# Patient Record
Sex: Female | Born: 1945 | Race: Black or African American | Hispanic: No | Marital: Single | Smoking: Current every day smoker
Health system: Southern US, Community
[De-identification: ages and names within clinical notes are randomized; demographics above are authoritative.]

## PROBLEM LIST (undated history)

## (undated) DIAGNOSIS — E119 Type 2 diabetes mellitus without complications: Secondary | ICD-10-CM

## (undated) DIAGNOSIS — J449 Chronic obstructive pulmonary disease, unspecified: Secondary | ICD-10-CM

## (undated) DIAGNOSIS — I10 Essential (primary) hypertension: Secondary | ICD-10-CM

## (undated) DIAGNOSIS — J309 Allergic rhinitis, unspecified: Secondary | ICD-10-CM

## (undated) DIAGNOSIS — G43909 Migraine, unspecified, not intractable, without status migrainosus: Secondary | ICD-10-CM

## (undated) DIAGNOSIS — G629 Polyneuropathy, unspecified: Secondary | ICD-10-CM

## (undated) DIAGNOSIS — E785 Hyperlipidemia, unspecified: Secondary | ICD-10-CM

## (undated) HISTORY — PX: NO PAST SURGERIES: SHX2092

## (undated) HISTORY — DX: Essential (primary) hypertension: I10

## (undated) HISTORY — DX: Migraine, unspecified, not intractable, without status migrainosus: G43.909

## (undated) HISTORY — DX: Type 2 diabetes mellitus without complications: E11.9

---

## 2015-10-01 ENCOUNTER — Ambulatory Visit (INDEPENDENT_AMBULATORY_CARE_PROVIDER_SITE_OTHER): Payer: Medicare Other

## 2015-10-01 ENCOUNTER — Ambulatory Visit (INDEPENDENT_AMBULATORY_CARE_PROVIDER_SITE_OTHER): Payer: Medicare Other | Admitting: Podiatry

## 2015-10-01 ENCOUNTER — Encounter: Payer: Self-pay | Admitting: Podiatry

## 2015-10-01 VITALS — BP 153/93 | HR 86 | Resp 16

## 2015-10-01 DIAGNOSIS — R261 Paralytic gait: Secondary | ICD-10-CM

## 2015-10-01 DIAGNOSIS — E1149 Type 2 diabetes mellitus with other diabetic neurological complication: Secondary | ICD-10-CM | POA: Diagnosis not present

## 2015-10-01 DIAGNOSIS — R52 Pain, unspecified: Secondary | ICD-10-CM

## 2015-10-01 DIAGNOSIS — R2689 Other abnormalities of gait and mobility: Secondary | ICD-10-CM

## 2015-10-01 DIAGNOSIS — M8430XA Stress fracture, unspecified site, initial encounter for fracture: Secondary | ICD-10-CM | POA: Diagnosis not present

## 2015-10-01 DIAGNOSIS — M21379 Foot drop, unspecified foot: Secondary | ICD-10-CM

## 2015-10-01 NOTE — Progress Notes (Signed)
   Subjective:    Patient ID: Sabrina Hanson, female    DOB: 1946-07-28, 70 y.o.   MRN: [id number]  HPI  70 year old female presents the office today with her son for concerns of right foot pain and she's had difficulty walking over the last year. She states that she has weakness to her right foot and she feels that she drags her right foot when she walks. There is been no recent injury or trauma at the time of onset of symptoms or recently. She's had no history of stroke previously. She does have a bunion on the right side for which her son is concerned about. There is been no swelling or redness. No calf pain with walking. Pain seems be localized to the foot.  She is diabetic and states her blood sugars typically "good". Yesterday morning it is 98. She gets tingling and numbness to her foot with the right worse than the left.    Review of Systems  Constitutional:       Sinus problems  Cardiovascular: Positive for leg swelling.       Calf pain with walking  Neurological: Positive for headaches.       Objective:   Physical Exam General: AAO x3, NAD  Dermatological: Skin is warm, dry and supple bilateral. There are no open sores, no preulcerative lesions, no rash or signs of infection present.  Vascular: Dorsalis Pedis artery and Posterior Tibial artery pedal pulses are 1/4 bilateral with immedate capillary fill time. There is no pain with calf compression, swelling, warmth, erythema.   Neruologic: There appears to be decrease in sensation with Semmes-Weinstein monofilament bilaterally as well as decreased vibratory sensation on the right side. Achilles tendon reflex decreased on the right.  Musculoskeletal: HAV is present right > left. There is tenderness to palpation of the first metatarsal right foot. No other areas of pinpoint bony tenderness to bilateral lower extremities. There is greatly reduced dorsiflexion of the right ankle compared to the left, otherwise MMT intact. Range of  motion appears to be intact.  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 There is a transverse sclerotic line of the first metatarsal diaphysis. She does have pain upon this area. Cannot rule out stress fracture or recent injury. Will immobilize and a surgical shoe which was dispensed today. 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 -Arterial studies to evaluate for circulation. -Patient's son was concerned about the bunion and wanting the bunion corrected. However given her other issues we will on that for now. -Discussed physical therapy however will await neurology evaluation. Also discussed bracing and diabetic shoes. -Follow-up 2-3 weeks or sooner if any problems arise. In the meantime, encouraged to call the office with any questions, concerns, change in symptoms.   Celesta Gentile, DPM

## 2015-10-05 ENCOUNTER — Telehealth: Payer: Self-pay | Admitting: *Deleted

## 2015-10-05 DIAGNOSIS — R2689 Other abnormalities of gait and mobility: Principal | ICD-10-CM

## 2015-10-05 DIAGNOSIS — M21379 Foot drop, unspecified foot: Secondary | ICD-10-CM

## 2015-10-05 NOTE — Telephone Encounter (Addendum)
-----   Message from Trula Slade, DPM sent at 10/04/2015  5:10 PM EST ----- She needs neurology evaluation for drop foot on the right and neuropathy.   Also, arterial studies for decreased circulation.   Thanks. 10/05/2015 - faxed required [facility] referral form, pt clinicals and demographics.  Referral to [facility] faxed with pt demographic.

## 2015-10-08 ENCOUNTER — Other Ambulatory Visit: Payer: Self-pay | Admitting: Podiatry

## 2015-10-08 DIAGNOSIS — M21379 Foot drop, unspecified foot: Secondary | ICD-10-CM

## 2015-10-08 DIAGNOSIS — R2689 Other abnormalities of gait and mobility: Principal | ICD-10-CM

## 2015-10-15 ENCOUNTER — Encounter (HOSPITAL_COMMUNITY): Payer: Self-pay

## 2015-10-21 ENCOUNTER — Ambulatory Visit (HOSPITAL_COMMUNITY)
Admission: RE | Admit: 2015-10-21 | Discharge: 2015-10-21 | Disposition: A | Discharge status: Home / Self Care | Payer: Medicare Other | Source: Ambulatory Visit | Attending: Podiatry | Admitting: Podiatry

## 2015-10-21 DIAGNOSIS — R938 Abnormal findings on diagnostic imaging of other specified body structures: Secondary | ICD-10-CM | POA: Insufficient documentation

## 2015-10-21 DIAGNOSIS — I700 Atherosclerosis of aorta: Secondary | ICD-10-CM | POA: Diagnosis not present

## 2015-10-21 DIAGNOSIS — R0989 Other specified symptoms and signs involving the circulatory and respiratory systems: Secondary | ICD-10-CM | POA: Diagnosis present

## 2015-10-21 DIAGNOSIS — I771 Stricture of artery: Secondary | ICD-10-CM | POA: Diagnosis not present

## 2015-10-21 DIAGNOSIS — I708 Atherosclerosis of other arteries: Secondary | ICD-10-CM | POA: Insufficient documentation

## 2015-10-21 DIAGNOSIS — E119 Type 2 diabetes mellitus without complications: Secondary | ICD-10-CM | POA: Insufficient documentation

## 2015-10-21 DIAGNOSIS — R261 Paralytic gait: Secondary | ICD-10-CM | POA: Diagnosis not present

## 2015-10-21 DIAGNOSIS — R2689 Other abnormalities of gait and mobility: Secondary | ICD-10-CM

## 2015-10-21 DIAGNOSIS — M21379 Foot drop, unspecified foot: Secondary | ICD-10-CM

## 2015-10-23 ENCOUNTER — Ambulatory Visit: Payer: Medicare Other | Admitting: Neurology

## 2015-10-26 ENCOUNTER — Ambulatory Visit (INDEPENDENT_AMBULATORY_CARE_PROVIDER_SITE_OTHER): Payer: Medicare Other

## 2015-10-26 ENCOUNTER — Ambulatory Visit (INDEPENDENT_AMBULATORY_CARE_PROVIDER_SITE_OTHER): Payer: Medicare Other | Admitting: Podiatry

## 2015-10-26 ENCOUNTER — Encounter: Payer: Self-pay | Admitting: Podiatry

## 2015-10-26 VITALS — BP 171/93 | HR 79 | Resp 16

## 2015-10-26 DIAGNOSIS — M8430XS Stress fracture, unspecified site, sequela: Secondary | ICD-10-CM | POA: Diagnosis not present

## 2015-10-26 DIAGNOSIS — R261 Paralytic gait: Secondary | ICD-10-CM | POA: Diagnosis not present

## 2015-10-26 DIAGNOSIS — I739 Peripheral vascular disease, unspecified: Secondary | ICD-10-CM | POA: Diagnosis not present

## 2015-10-26 DIAGNOSIS — M21379 Foot drop, unspecified foot: Secondary | ICD-10-CM

## 2015-10-26 DIAGNOSIS — R52 Pain, unspecified: Secondary | ICD-10-CM | POA: Diagnosis not present

## 2015-10-26 DIAGNOSIS — E1149 Type 2 diabetes mellitus with other diabetic neurological complication: Secondary | ICD-10-CM | POA: Diagnosis not present

## 2015-10-26 DIAGNOSIS — R2689 Other abnormalities of gait and mobility: Secondary | ICD-10-CM

## 2015-10-27 NOTE — Telephone Encounter (Signed)
-----   Message from Trula Slade, DPM sent at 10/27/2015  7:53 AM EST ----- Can you please order her consult with Dr. Gwenlyn Found? She has previous studies done which are abnormal.

## 2015-10-27 NOTE — Progress Notes (Signed)
Patient ID: Sabrina Hanson, female   DOB: 03/08/1946, 70 y.o.   MRN: [id number]  Subjective:  70 year old female presents the office today for follow-up evaluation of decreased pulses, possible right first metatarsal fracture as well as right foot drop foot. She states that she is doing well and she is having no pain of the foot. She has been continuing the surgical shoe of the right foot. Denies any swelling or redness. Denies any systemic complaints such as fevers, chills, nausea, vomiting. No acute changes since last appointment, and no other complaints at this time.   Objective: AAO x3, NAD DP/PT pulses decreased bilaterally, CRT less than 3 seconds Protective sensation decreased with Simms Weinstein monofilament Bunion deformity is present most on the right worse than the left with pre-ulcerative lesions over on the first metatarsal head. There is no overlying edema, erythema, increased warmth. There is no tenderness to palpation of the right first metatarsal is no overlying erythema or edema or increase in warmth. There is minimal dorsiflexion of the right ankle. Subjectively she does drag her foot.  No edema, erythema, increase in warmth to bilateral lower extremities.  No open lesions or pre-ulcerative lesions.  No pain with calf compression, swelling, warmth, erythema  Assessment: Possible fracture right 1st metatarsal, although likely old/healing; dropfoot; PVD  Plan: -All treatment options discussed with the patient including all alternatives, risks, complications.  -X-rays were obtained and reviewed with the patient.  A sclerotic line remains transversing the first metatarsal diaphysis which appears improved from the last one in. This is likely result of an old fracture or healing stress fracture. Continue surgical shoe for now.  -Will  order vascular consult for PVD. Arterial studies were reviewed which revealed 50-74% stenosis the right distal SFA with occluded peroneal bilaterally  and two-vessel runoff. ABI on the right 0.62, left 0.94 -Offloading pads for bunion/pre-ulcerative lesion. Monitor for any further skin breakdown.  - She has an upcoming neurology evaluation her dropfoot. Apparently this started recently.  -F/U in 2 weeks -Patient encouraged to call the office with any questions, concerns, change in symptoms.   Celesta Gentile, DPM

## 2015-10-28 ENCOUNTER — Encounter: Payer: Self-pay | Admitting: Neurology

## 2015-10-28 ENCOUNTER — Encounter: Payer: Self-pay | Admitting: *Deleted

## 2015-10-28 ENCOUNTER — Encounter (INDEPENDENT_AMBULATORY_CARE_PROVIDER_SITE_OTHER): Payer: Self-pay

## 2015-10-28 ENCOUNTER — Ambulatory Visit (INDEPENDENT_AMBULATORY_CARE_PROVIDER_SITE_OTHER): Payer: Medicare Other | Admitting: Neurology

## 2015-10-28 VITALS — BP 150/77 | HR 67 | Ht 62.0 in | Wt 187.4 lb

## 2015-10-28 DIAGNOSIS — M5417 Radiculopathy, lumbosacral region: Secondary | ICD-10-CM

## 2015-10-28 DIAGNOSIS — E0841 Diabetes mellitus due to underlying condition with diabetic mononeuropathy: Secondary | ICD-10-CM

## 2015-10-28 DIAGNOSIS — M21371 Foot drop, right foot: Secondary | ICD-10-CM | POA: Diagnosis not present

## 2015-10-28 DIAGNOSIS — E538 Deficiency of other specified B group vitamins: Secondary | ICD-10-CM | POA: Diagnosis not present

## 2015-10-28 DIAGNOSIS — G629 Polyneuropathy, unspecified: Secondary | ICD-10-CM

## 2015-10-28 DIAGNOSIS — W19XXXA Unspecified fall, initial encounter: Secondary | ICD-10-CM

## 2015-10-28 NOTE — Patient Instructions (Signed)
Remember to drink plenty of fluid, eat healthy meals and do not skip any meals. Try to eat protein with a every meal and eat a healthy snack such as fruit or nuts in between meals. Try to keep a regular sleep-wake schedule and try to exercise daily, particularly in the form of walking, 20-30 minutes a day, if you can.   As far as diagnostic testing: MRI lumbar spine, emg/ncs, labs, foot orthosis  I would like to see you back for emg/ncs, sooner if we need to. Please call us with any interim questions, concerns, problems, updates or refill requests.   Our phone number is [phone]. We also have an after hours call service for urgent matters and there is a physician on-call for urgent questions. For any emergencies you know to call 911 or go to the nearest emergency room

## 2015-10-28 NOTE — Progress Notes (Signed)
[facility]    Provider:  Dr Jaynee Eagles Referring Provider: No ref. provider found Primary Care Physician:  JESSA, [name] N, DO  CC:  Foot drop  HPI:  Sabrina Hanson is a 70 y.o. female here as a referral for foot drop. PMHx DM.  Patient says she fell over a year ago and at that time she was having no problem dragging the leg but the right leg was giving out. After that she was in a wheelchair, had a lot swelling in the foot. Very poor historian. Here with son who tries to provide information but he doesn't know either. Then slowly she had progressive weakness in the right leg, and numbness in the toes. Left leg is fine. At this point not getting better but not getting worse either. She has weakness and pain is around the ankle. She can't move her toes. She can't extend her toes. She has a boot on her right foot due to a hairline fracture. The boot goes to the ankle and it is for the fracture. Dr. Aleda Grana sent to PT for the foot drop and didn't help. Dragging her foot. She has low back pain It radiates from the low back does the lateral aspect and to the bottom of the toes. Left foot is unaffected. She doesn't know why she fell and says the back was not hurting atthat time. She has fallen. At the end of the appointment patient says her foot drop has been going on for longer than 3-4 years, son surprised. Patient is an extremely poor historian, unable to determine exact details.   Reviewed notes, labs and imaging from outside physicians, which showed:  XR right foot, personally reviewed images and agree with following: 3 views of a skeletally mature individual were obtained of the right foot.  Study includes AP, oblique, lateral projections.  there is a sclerotic line within diaphysis the first metatarsal concerning for stress fracture. Arthritic changes are present. Osteopenia is also present. There is no other evidence of acute fracture or stress fracture.  Review of  Systems: Patient complains of symptoms per HPI as well as the following symptoms: no CP, no SOB. Pertinent negatives per HPI. All others negative.   Social History   Social History  . Marital Status: Single    Spouse Name: N/A  . Number of Children: 1  . Years of Education: 12   Occupational History  . Retired    Social History Main Topics  . Smoking status: Current Every Day Smoker  . Smokeless tobacco: Never Used  . Alcohol Use: No  . Drug Use: No  . Sexual Activity: Not on file   Other Topics Concern  . Not on file   Social History Narrative   Lives alone   Caffeine use: Daily     Family History  Problem Relation Age of Onset  . Neurofibromatosis Neg Hx     Past Medical History  Diagnosis Date  . Diabetes mellitus without complication ([place])   . Migraine   . Hypertension     Past Surgical History  Procedure Laterality Date  . No past surgeries      Current Outpatient Prescriptions  Medication Sig Dispense Refill  . CETIRIZINE HCL PO Take 10 mg by mouth daily.     . CYCLOBENZAPRINE HCL PO Take 5 mg by mouth 3 (three) times daily.     Marland Kitchen GLIMEPIRIDE PO Take 1 mg by mouth daily with supper.     Marland Kitchen HYDROCHLOROTHIAZIDE PO Take 12.5  mg by mouth daily.     Marland Kitchen HYDROcodone-acetaminophen (NORCO) 10-325 MG tablet Take 1 tablet by mouth every 6 (six) hours as needed.    . insulin aspart (NOVOLOG) 100 UNIT/ML injection Inject 18 Units into the skin once. At night    . LISINOPRIL PO Take 40 mg by mouth daily.     Marland Kitchen METFORMIN HCL PO Take 1,000 mg by mouth 2 (two) times daily with a meal.     . METOPROLOL TARTRATE PO Take 50 mg by mouth 2 (two) times daily.     . Pregabalin (LYRICA PO) Take 150 mg by mouth 3 (three) times daily.     . vitamin B-12 (CYANOCOBALAMIN) 1000 MCG tablet Take 1,000 mcg by mouth daily.    . FUROSEMIDE PO Take by mouth. Reported on 10/28/2015    . HYDROCODONE-IBUPROFEN PO Take by mouth. Reported on 10/28/2015     No current facility-administered  medications for this visit.    Allergies as of 10/28/2015  . (No Known Allergies)    Vitals: BP 150/77 mmHg  Pulse 67  Ht 5\' 2"  (1.575 m)  Wt 187 lb 6.4 oz (85.004 kg)  BMI 34.27 kg/m2 Last Weight:  Wt Readings from Last 1 Encounters:  10/28/15 187 lb 6.4 oz (85.004 kg)   Last Height:   Ht Readings from Last 1 Encounters:  10/28/15 5\' 2"  (1.575 m)    Physical exam: Exam: Gen: NAD, obese                CV: RRR, no MRG. No Carotid Bruits. No peripheral edema, warm, nontender Eyes: Conjunctivae clear without exudates or hemorrhage  Neuro: Detailed Neurologic Exam  Speech:    Speech is normal; fluent and spontaneous with normal comprehension.  Cognition:    The patient is oriented to person, place, and time;     recent and remote memory intact;     language fluent;     normal attention, concentration,     fund of knowledge Cranial Nerves:    The pupils are equal, round, and reactive to light. The fundi are normal and spontaneous venous pulsations are present. Visual fields are full to finger confrontation. Extraocular movements are intact. Trigeminal sensation is intact and the muscles of mastication are normal. The face is symmetric. The palate elevates in the midline. Hearing intact. Voice is normal. Shoulder shrug is normal. The tongue has normal motion without fasciculations.   Coordination:    Normal finger to nose and heel to shin. Normal rapid alternating movements.   Gait:    Steppage gait  Motor Observation:    No asymmetry, no atrophy, and no involuntary movements noted. Tone:    Normal muscle tone.    Posture:    Posture is normal.     Strength:    Weakness right dorsiflexion and eversion     Sensation: Decreased distally to pp, temp     Reflex Exam:  DTR's:    Deep tendon reflexes in the upper and lower extremities are symmetrical bilaterally.   Toes:    The toes are downgoing bilaterally.   Clonus:    Clonus is absent.       Assessment/Plan:  70 year old with right foot drop for several years.  As far as diagnostic testing: MRI lumbar spine, emg/ncs, labs, foot orthosis, physical therapy  I would like to see you back for emg/ncs, sooner if we need to. Please call us with any interim questions, concerns, problems, updates or refill requests.  Fall risk, orthosis and PT wil help. Advised to use walking aid such as a cane until further eval from PT.  CC: Dr. Dawayne Cirri, [facility] [address] [place], [place] [address]  Phone [phone] Fax [phone]

## 2015-10-29 ENCOUNTER — Telehealth: Payer: Self-pay | Admitting: *Deleted

## 2015-10-29 LAB — COMPREHENSIVE METABOLIC PANEL
A/G RATIO: 1.4 (ref 1.1–2.5)
ALK PHOS: 58 IU/L (ref 39–117)
ALT: 15 IU/L (ref 0–32)
AST: 20 IU/L (ref 0–40)
Albumin: 4.1 g/dL (ref 3.6–4.8)
BUN/Creatinine Ratio: 15 (ref 11–26)
BUN: 22 mg/dL (ref 8–27)
Bilirubin Total: <0.2 mg/dL (ref 0.0–1.2)
CALCIUM: 9.8 mg/dL (ref 8.7–10.3)
CO2: 22 mmol/L (ref 18–29)
Chloride: 104 mmol/L (ref 96–106)
Creatinine, Ser: 1.47 mg/dL — ABNORMAL HIGH (ref 0.57–1.00)
GFR calc Af Amer: 42 mL/min/{1.73_m2} — ABNORMAL LOW (ref >59)
GFR, EST NON AFRICAN AMERICAN: 36 mL/min/{1.73_m2} — AB (ref >59)
Globulin, Total: 3 g/dL (ref 1.5–4.5)
Glucose: 83 mg/dL (ref 65–99)
POTASSIUM: 5.5 mmol/L — AB (ref 3.5–5.2)
Sodium: 141 mmol/L (ref 134–144)
Total Protein: 7.1 g/dL (ref 6.0–8.5)

## 2015-10-29 LAB — HEMOGLOBIN A1C
ESTIMATED AVERAGE GLUCOSE: 154 mg/dL
Hgb A1c MFr Bld: 7 % — ABNORMAL HIGH (ref 4.8–5.6)

## 2015-10-29 LAB — B12 AND FOLATE PANEL
FOLATE: 6.5 ng/mL (ref >3.0)
VITAMIN B 12: 548 pg/mL (ref 211–946)

## 2015-10-29 NOTE — Telephone Encounter (Signed)
-----   Message from Melvenia Beam, MD sent at 10/29/2015  7:27 AM EST ----- Patient's hgba1c is 7 consistent with diabetes and average daily glucose over the last 3 months of 154. B12 and folate normal. She also has kidney disease with an elevated creatine that she should be following with her primary care about. thanks

## 2015-10-29 NOTE — Telephone Encounter (Signed)
Tried calling pt about results. Kept ringing and then asked for a remote access code. Unable to leave VM. Will try again later.

## 2015-10-30 NOTE — Telephone Encounter (Signed)
Tried pt phone number again. Rang several times and then asked for a remote access code. Unable to LVM. Called son, Hilliard Clark (listed on DPR) and advised I could not reach mother about labs. Offered for me to give him results, but he is going to have mother call back. Gave him [facility] hours.

## 2015-11-01 ENCOUNTER — Encounter: Payer: Self-pay | Admitting: Neurology

## 2015-11-01 DIAGNOSIS — M21371 Foot drop, right foot: Secondary | ICD-10-CM | POA: Insufficient documentation

## 2015-11-01 DIAGNOSIS — G629 Polyneuropathy, unspecified: Secondary | ICD-10-CM | POA: Insufficient documentation

## 2015-11-01 DIAGNOSIS — E119 Type 2 diabetes mellitus without complications: Secondary | ICD-10-CM | POA: Insufficient documentation

## 2015-11-02 ENCOUNTER — Encounter: Payer: Self-pay | Admitting: *Deleted

## 2015-11-02 ENCOUNTER — Telehealth: Payer: Self-pay | Admitting: Cardiovascular Disease

## 2015-11-02 NOTE — Telephone Encounter (Signed)
Sent letter to pt regarding results.

## 2015-11-02 NOTE — Telephone Encounter (Signed)
Received records from [place] for appointment on 11/06/15 with Dr Gwenlyn Found.  Records given to [facility] (medical records) for Dr Kennon Holter schedule on 11/06/15. lp

## 2015-11-03 ENCOUNTER — Encounter: Payer: Self-pay | Admitting: *Deleted

## 2015-11-03 NOTE — Progress Notes (Signed)
Mailed signed order for right foot orthosis back to [employer].

## 2015-11-04 ENCOUNTER — Telehealth: Payer: Self-pay | Admitting: Cardiovascular Disease

## 2015-11-04 NOTE — Telephone Encounter (Signed)
Patient's son Sabrina Hanson) was returning Sabrina Hanson's phone call from earlier today.   I asked him if the appointment that Sabrina Hanson had to rescheduled for 11/04/15 at 2:45pm was going to work for his mother and he said he would check with his mom but would prefer for Sabrina Hanson to call him back tomorrow about this.  Sabrina Hanson confirmed the best number to reach him at is Regarding [phone])

## 2015-11-06 ENCOUNTER — Encounter: Payer: Medicare Other | Admitting: Cardiovascular Disease

## 2015-11-09 ENCOUNTER — Ambulatory Visit: Payer: Medicare Other | Admitting: Podiatry

## 2015-11-17 ENCOUNTER — Encounter: Payer: Medicare Other | Admitting: Neurology

## 2015-11-18 ENCOUNTER — Encounter: Payer: Self-pay | Admitting: Neurology

## 2015-11-24 ENCOUNTER — Encounter: Payer: Medicare Other | Admitting: Cardiovascular Disease

## 2015-11-27 ENCOUNTER — Ambulatory Visit: Payer: Medicare Other | Admitting: Podiatry

## 2015-12-03 ENCOUNTER — Encounter: Payer: Self-pay | Admitting: Cardiovascular Disease

## 2021-09-06 ENCOUNTER — Inpatient Hospital Stay (HOSPITAL_COMMUNITY): Payer: Medicare Other

## 2021-09-06 ENCOUNTER — Emergency Department (HOSPITAL_COMMUNITY): Payer: Medicare Other

## 2021-09-06 ENCOUNTER — Encounter (HOSPITAL_COMMUNITY): Payer: Self-pay | Admitting: Emergency Medicine

## 2021-09-06 ENCOUNTER — Inpatient Hospital Stay (HOSPITAL_COMMUNITY)
Admission: EM | Admit: 2021-09-06 | Discharge: 2021-09-08 | DRG: 193 | Disposition: A | Discharge status: Home / Self Care | Payer: Medicare Other | Attending: Internal Medicine | Admitting: Internal Medicine

## 2021-09-06 ENCOUNTER — Other Ambulatory Visit: Payer: Self-pay

## 2021-09-06 DIAGNOSIS — E1151 Type 2 diabetes mellitus with diabetic peripheral angiopathy without gangrene: Secondary | ICD-10-CM | POA: Diagnosis present

## 2021-09-06 DIAGNOSIS — D631 Anemia in chronic kidney disease: Secondary | ICD-10-CM | POA: Diagnosis present

## 2021-09-06 DIAGNOSIS — E1142 Type 2 diabetes mellitus with diabetic polyneuropathy: Secondary | ICD-10-CM | POA: Diagnosis present

## 2021-09-06 DIAGNOSIS — R0603 Acute respiratory distress: Secondary | ICD-10-CM | POA: Diagnosis present

## 2021-09-06 DIAGNOSIS — J441 Chronic obstructive pulmonary disease with (acute) exacerbation: Secondary | ICD-10-CM | POA: Diagnosis present

## 2021-09-06 DIAGNOSIS — J309 Allergic rhinitis, unspecified: Secondary | ICD-10-CM | POA: Diagnosis present

## 2021-09-06 DIAGNOSIS — J9602 Acute respiratory failure with hypercapnia: Secondary | ICD-10-CM | POA: Diagnosis present

## 2021-09-06 DIAGNOSIS — I10 Essential (primary) hypertension: Secondary | ICD-10-CM | POA: Diagnosis present

## 2021-09-06 DIAGNOSIS — I129 Hypertensive chronic kidney disease with stage 1 through stage 4 chronic kidney disease, or unspecified chronic kidney disease: Secondary | ICD-10-CM | POA: Diagnosis present

## 2021-09-06 DIAGNOSIS — R7989 Other specified abnormal findings of blood chemistry: Secondary | ICD-10-CM | POA: Diagnosis present

## 2021-09-06 DIAGNOSIS — E119 Type 2 diabetes mellitus without complications: Secondary | ICD-10-CM

## 2021-09-06 DIAGNOSIS — Z7984 Long term (current) use of oral hypoglycemic drugs: Secondary | ICD-10-CM | POA: Diagnosis not present

## 2021-09-06 DIAGNOSIS — J449 Chronic obstructive pulmonary disease, unspecified: Secondary | ICD-10-CM | POA: Diagnosis present

## 2021-09-06 DIAGNOSIS — J9601 Acute respiratory failure with hypoxia: Secondary | ICD-10-CM | POA: Diagnosis present

## 2021-09-06 DIAGNOSIS — R339 Retention of urine, unspecified: Secondary | ICD-10-CM | POA: Diagnosis present

## 2021-09-06 DIAGNOSIS — Z20822 Contact with and (suspected) exposure to covid-19: Secondary | ICD-10-CM | POA: Diagnosis present

## 2021-09-06 DIAGNOSIS — J440 Chronic obstructive pulmonary disease with acute lower respiratory infection: Secondary | ICD-10-CM | POA: Diagnosis present

## 2021-09-06 DIAGNOSIS — N184 Chronic kidney disease, stage 4 (severe): Secondary | ICD-10-CM | POA: Diagnosis present

## 2021-09-06 DIAGNOSIS — Z885 Allergy status to narcotic agent status: Secondary | ICD-10-CM

## 2021-09-06 DIAGNOSIS — Y95 Nosocomial condition: Secondary | ICD-10-CM | POA: Diagnosis present

## 2021-09-06 DIAGNOSIS — Z888 Allergy status to other drugs, medicaments and biological substances status: Secondary | ICD-10-CM

## 2021-09-06 DIAGNOSIS — E1122 Type 2 diabetes mellitus with diabetic chronic kidney disease: Secondary | ICD-10-CM | POA: Diagnosis present

## 2021-09-06 DIAGNOSIS — Z79899 Other long term (current) drug therapy: Secondary | ICD-10-CM | POA: Diagnosis not present

## 2021-09-06 DIAGNOSIS — F17200 Nicotine dependence, unspecified, uncomplicated: Secondary | ICD-10-CM | POA: Diagnosis present

## 2021-09-06 DIAGNOSIS — J189 Pneumonia, unspecified organism: Secondary | ICD-10-CM | POA: Diagnosis present

## 2021-09-06 DIAGNOSIS — R0602 Shortness of breath: Secondary | ICD-10-CM | POA: Diagnosis not present

## 2021-09-06 DIAGNOSIS — Z87820 Personal history of traumatic brain injury: Secondary | ICD-10-CM

## 2021-09-06 DIAGNOSIS — E785 Hyperlipidemia, unspecified: Secondary | ICD-10-CM | POA: Diagnosis present

## 2021-09-06 DIAGNOSIS — Z794 Long term (current) use of insulin: Secondary | ICD-10-CM | POA: Diagnosis not present

## 2021-09-06 HISTORY — DX: Hyperlipidemia, unspecified: E78.5

## 2021-09-06 HISTORY — DX: Chronic obstructive pulmonary disease, unspecified: J44.9

## 2021-09-06 HISTORY — DX: Polyneuropathy, unspecified: G62.9

## 2021-09-06 HISTORY — DX: Allergic rhinitis, unspecified: J30.9

## 2021-09-06 LAB — BLOOD GAS, VENOUS
Acid-base deficit: 2.3 mmol/L — ABNORMAL HIGH (ref 0.0–2.0)
Acid-base deficit: 3.1 mmol/L — ABNORMAL HIGH (ref 0.0–2.0)
Bicarbonate: 24.5 mmol/L (ref 20.0–28.0)
Bicarbonate: 24.5 mmol/L (ref 20.0–28.0)
O2 Saturation: 55.9 %
O2 Saturation: 54.4 %
Patient temperature: 98.1
Patient temperature: 98.6
pCO2, Ven: 54.5 mmHg (ref 44.0–60.0)
pCO2, Ven: 62.5 mmHg — ABNORMAL HIGH (ref 44.0–60.0)
pH, Ven: 7.273 (ref 7.250–7.430)
pH, Ven: 7.218 — ABNORMAL LOW (ref 7.250–7.430)
pO2, Ven: 33.4 mmHg (ref 32.0–45.0)
pO2, Ven: 34.9 mmHg (ref 32.0–45.0)

## 2021-09-06 LAB — CBC WITH DIFFERENTIAL/PLATELET
Abs Immature Granulocytes: 0.04 10*3/uL (ref 0.00–0.07)
Basophils Absolute: 0 10*3/uL (ref 0.0–0.1)
Basophils Relative: 0 %
Eosinophils Absolute: 0 10*3/uL (ref 0.0–0.5)
Eosinophils Relative: 0 %
HCT: 28.2 % — ABNORMAL LOW (ref 36.0–46.0)
Hemoglobin: 8.1 g/dL — ABNORMAL LOW (ref 12.0–15.0)
Immature Granulocytes: 1 %
Lymphocytes Relative: 4 %
Lymphs Abs: 0.3 10*3/uL — ABNORMAL LOW (ref 0.7–4.0)
MCH: 27.6 pg (ref 26.0–34.0)
MCHC: 28.7 g/dL — ABNORMAL LOW (ref 30.0–36.0)
MCV: 95.9 fL (ref 80.0–100.0)
Monocytes Absolute: 0.1 10*3/uL (ref 0.1–1.0)
Monocytes Relative: 1 %
Neutro Abs: 8 10*3/uL — ABNORMAL HIGH (ref 1.7–7.7)
Neutrophils Relative %: 94 %
Platelets: 209 10*3/uL (ref 150–400)
RBC: 2.94 MIL/uL — ABNORMAL LOW (ref 3.87–5.11)
RDW: 15.8 % — ABNORMAL HIGH (ref 11.5–15.5)
WBC: 8.6 10*3/uL (ref 4.0–10.5)
nRBC: 0 % (ref 0.0–0.2)

## 2021-09-06 LAB — COMPREHENSIVE METABOLIC PANEL
ALT: 15 U/L (ref 0–44)
ALT: 15 U/L (ref 0–44)
AST: 19 U/L (ref 15–41)
AST: 21 U/L (ref 15–41)
Albumin: 3.5 g/dL (ref 3.5–5.0)
Albumin: 3.3 g/dL — ABNORMAL LOW (ref 3.5–5.0)
Alkaline Phosphatase: 85 U/L (ref 38–126)
Alkaline Phosphatase: 84 U/L (ref 38–126)
Anion gap: 10 (ref 5–15)
Anion gap: 10 (ref 5–15)
BUN: 34 mg/dL — ABNORMAL HIGH (ref 8–23)
BUN: 35 mg/dL — ABNORMAL HIGH (ref 8–23)
CO2: 22 mmol/L (ref 22–32)
CO2: 21 mmol/L — ABNORMAL LOW (ref 22–32)
Calcium: 9.2 mg/dL (ref 8.9–10.3)
Calcium: 9 mg/dL (ref 8.9–10.3)
Chloride: 105 mmol/L (ref 98–111)
Chloride: 105 mmol/L (ref 98–111)
Creatinine, Ser: 1.8 mg/dL — ABNORMAL HIGH (ref 0.44–1.00)
Creatinine, Ser: 1.93 mg/dL — ABNORMAL HIGH (ref 0.44–1.00)
GFR, Estimated: 29 mL/min — ABNORMAL LOW (ref >60)
GFR, Estimated: 27 mL/min — ABNORMAL LOW (ref >60)
Glucose, Bld: 125 mg/dL — ABNORMAL HIGH (ref 70–99)
Glucose, Bld: 146 mg/dL — ABNORMAL HIGH (ref 70–99)
Potassium: 4.3 mmol/L (ref 3.5–5.1)
Potassium: 4.5 mmol/L (ref 3.5–5.1)
Sodium: 137 mmol/L (ref 135–145)
Sodium: 136 mmol/L (ref 135–145)
Total Bilirubin: 0.8 mg/dL (ref 0.3–1.2)
Total Bilirubin: 0.9 mg/dL (ref 0.3–1.2)
Total Protein: 7.2 g/dL (ref 6.5–8.1)
Total Protein: 7.1 g/dL (ref 6.5–8.1)

## 2021-09-06 LAB — PROTIME-INR
INR: 1 (ref 0.8–1.2)
Prothrombin Time: 13.2 seconds (ref 11.4–15.2)

## 2021-09-06 LAB — PROCALCITONIN: Procalcitonin: <0.1 ng/mL

## 2021-09-06 LAB — RESP PANEL BY RT-PCR (FLU A&B, COVID) ARPGX2
Influenza A by PCR: NEGATIVE
Influenza B by PCR: NEGATIVE
SARS Coronavirus 2 by RT PCR: NEGATIVE

## 2021-09-06 LAB — BRAIN NATRIURETIC PEPTIDE: B Natriuretic Peptide: 47.2 pg/mL (ref 0.0–100.0)

## 2021-09-06 LAB — GLUCOSE, CAPILLARY
Glucose-Capillary: 188 mg/dL — ABNORMAL HIGH (ref 70–99)
Glucose-Capillary: 169 mg/dL — ABNORMAL HIGH (ref 70–99)
Glucose-Capillary: 185 mg/dL — ABNORMAL HIGH (ref 70–99)

## 2021-09-06 LAB — CBC
HCT: 29.7 % — ABNORMAL LOW (ref 36.0–46.0)
Hemoglobin: 8.7 g/dL — ABNORMAL LOW (ref 12.0–15.0)
MCH: 27.7 pg (ref 26.0–34.0)
MCHC: 29.3 g/dL — ABNORMAL LOW (ref 30.0–36.0)
MCV: 94.6 fL (ref 80.0–100.0)
Platelets: 225 10*3/uL (ref 150–400)
RBC: 3.14 MIL/uL — ABNORMAL LOW (ref 3.87–5.11)
RDW: 15.9 % — ABNORMAL HIGH (ref 11.5–15.5)
WBC: 7.1 10*3/uL (ref 4.0–10.5)
nRBC: 0 % (ref 0.0–0.2)

## 2021-09-06 LAB — PHOSPHORUS: Phosphorus: 4.9 mg/dL — ABNORMAL HIGH (ref 2.5–4.6)

## 2021-09-06 LAB — MRSA NEXT GEN BY PCR, NASAL: MRSA by PCR Next Gen: NOT DETECTED

## 2021-09-06 LAB — MAGNESIUM
Magnesium: 3.2 mg/dL — ABNORMAL HIGH (ref 1.7–2.4)
Magnesium: 3.2 mg/dL — ABNORMAL HIGH (ref 1.7–2.4)

## 2021-09-06 LAB — D-DIMER, QUANTITATIVE: D-Dimer, Quant: 5.34 ug/mL-FEU — ABNORMAL HIGH (ref 0.00–0.50)

## 2021-09-06 MED ORDER — METHYLPREDNISOLONE SODIUM SUCC 125 MG IJ SOLR
80.0000 mg | Freq: Two times a day (BID) | INTRAMUSCULAR | Status: DC
  Administered 2021-09-06 – 2021-09-07 (×3): 80 mg via INTRAVENOUS
  Filled 2021-09-06 (×3): qty 2

## 2021-09-06 MED ORDER — IPRATROPIUM-ALBUTEROL 0.5-2.5 (3) MG/3ML IN SOLN
3.0000 mL | Freq: Four times a day (QID) | RESPIRATORY_TRACT | Status: DC
  Administered 2021-09-06 – 2021-09-07 (×7): 3 mL via RESPIRATORY_TRACT
  Filled 2021-09-06 (×6): qty 3

## 2021-09-06 MED ORDER — HEPARIN SODIUM (PORCINE) 5000 UNIT/ML IJ SOLN
5000.0000 [IU] | Freq: Three times a day (TID) | INTRAMUSCULAR | Status: DC
  Administered 2021-09-06 – 2021-09-08 (×6): 5000 [IU] via SUBCUTANEOUS
  Filled 2021-09-06 (×6): qty 1

## 2021-09-06 MED ORDER — GABAPENTIN 100 MG PO CAPS
100.0000 mg | ORAL_CAPSULE | Freq: Every day | ORAL | Status: DC
  Administered 2021-09-06 – 2021-09-07 (×2): 100 mg via ORAL
  Filled 2021-09-06 (×2): qty 1

## 2021-09-06 MED ORDER — ACETAMINOPHEN 650 MG RE SUPP: 650.0000 mg | Freq: Four times a day (QID) | RECTAL | Status: DC | PRN

## 2021-09-06 MED ORDER — SODIUM CHLORIDE 0.9 % IV SOLN
1.0000 g | Freq: Once | INTRAVENOUS | Status: AC
  Administered 2021-09-06: 04:00:00 1 g via INTRAVENOUS
  Filled 2021-09-06: qty 10

## 2021-09-06 MED ORDER — AMLODIPINE BESYLATE 10 MG PO TABS
10.0000 mg | ORAL_TABLET | Freq: Every day | ORAL | Status: DC
  Administered 2021-09-06 – 2021-09-07 (×2): 10 mg via ORAL
  Filled 2021-09-06 (×3): qty 1

## 2021-09-06 MED ORDER — HYDROCODONE-ACETAMINOPHEN 5-325 MG PO TABS
1.0000 | ORAL_TABLET | Freq: Four times a day (QID) | ORAL | Status: DC | PRN
  Administered 2021-09-07: 18:00:00 1 via ORAL
  Filled 2021-09-06: qty 1

## 2021-09-06 MED ORDER — HYDRALAZINE HCL 10 MG PO TABS
10.0000 mg | ORAL_TABLET | Freq: Four times a day (QID) | ORAL | Status: DC | PRN
Start: 2021-09-06 — End: 2021-09-08
  Administered 2021-09-06: 12:00:00 10 mg via ORAL
  Filled 2021-09-06 (×2): qty 1

## 2021-09-06 MED ORDER — ACETAMINOPHEN 325 MG PO TABS
650.0000 mg | ORAL_TABLET | Freq: Four times a day (QID) | ORAL | Status: DC | PRN
  Administered 2021-09-07 – 2021-09-08 (×2): 650 mg via ORAL
  Filled 2021-09-06 (×2): qty 2

## 2021-09-06 MED ORDER — NALOXONE HCL 0.4 MG/ML IJ SOLN: 0.4000 mg | INTRAMUSCULAR | Status: DC | PRN

## 2021-09-06 MED ORDER — VANCOMYCIN HCL IN DEXTROSE 1-5 GM/200ML-% IV SOLN
1000.0000 mg | Freq: Once | INTRAVENOUS | Status: AC
  Administered 2021-09-06: 04:00:00 1000 mg via INTRAVENOUS
  Filled 2021-09-06: qty 200

## 2021-09-06 MED ORDER — LORATADINE 10 MG PO TABS
5.0000 mg | ORAL_TABLET | Freq: Every day | ORAL | Status: DC
  Administered 2021-09-06: 09:00:00 5 mg via ORAL
  Filled 2021-09-06 (×3): qty 1

## 2021-09-06 MED ORDER — ALBUTEROL SULFATE (2.5 MG/3ML) 0.083% IN NEBU: 2.5000 mg | INHALATION_SOLUTION | RESPIRATORY_TRACT | Status: DC | PRN

## 2021-09-06 MED ORDER — SODIUM CHLORIDE 0.9 % IV SOLN
2.0000 g | INTRAVENOUS | Status: DC
  Administered 2021-09-06 – 2021-09-08 (×3): 2 g via INTRAVENOUS
  Filled 2021-09-06 (×3): qty 2

## 2021-09-06 MED ORDER — TECHNETIUM TO 99M ALBUMIN AGGREGATED
4.2700 | Freq: Once | INTRAVENOUS | Status: AC
  Administered 2021-09-06: 13:00:00 4.27 via INTRAVENOUS

## 2021-09-06 MED ORDER — ROSUVASTATIN CALCIUM 10 MG PO TABS
10.0000 mg | ORAL_TABLET | Freq: Every day | ORAL | Status: DC
  Administered 2021-09-06: 09:00:00 10 mg via ORAL
  Filled 2021-09-06 (×4): qty 1

## 2021-09-06 MED ORDER — VANCOMYCIN HCL 750 MG/150ML IV SOLN: 750.0000 mg | INTRAVENOUS | Status: DC

## 2021-09-06 MED ORDER — ASPIRIN EC 81 MG PO TBEC
81.0000 mg | DELAYED_RELEASE_TABLET | Freq: Every day | ORAL | Status: DC
  Administered 2021-09-06 – 2021-09-08 (×3): 81 mg via ORAL
  Filled 2021-09-06 (×3): qty 1

## 2021-09-06 MED ORDER — TAMSULOSIN HCL 0.4 MG PO CAPS
0.4000 mg | ORAL_CAPSULE | Freq: Every day | ORAL | Status: DC
  Administered 2021-09-06 – 2021-09-08 (×3): 0.4 mg via ORAL
  Filled 2021-09-06 (×3): qty 1

## 2021-09-06 MED ORDER — CHLORHEXIDINE GLUCONATE CLOTH 2 % EX PADS
6.0000 | MEDICATED_PAD | Freq: Every day | CUTANEOUS | Status: DC
  Administered 2021-09-06: 09:00:00 6 via TOPICAL

## 2021-09-06 MED ORDER — ORAL CARE MOUTH RINSE
15.0000 mL | Freq: Two times a day (BID) | OROMUCOSAL | Status: DC
  Administered 2021-09-06 – 2021-09-07 (×4): 15 mL via OROMUCOSAL

## 2021-09-06 MED ORDER — MELATONIN 3 MG PO TABS: 3.0000 mg | ORAL_TABLET | Freq: Every evening | ORAL | Status: DC | PRN

## 2021-09-06 MED ORDER — INSULIN ASPART 100 UNIT/ML IJ SOLN
0.0000 [IU] | Freq: Three times a day (TID) | INTRAMUSCULAR | Status: DC
  Administered 2021-09-06 (×2): 2 [IU] via SUBCUTANEOUS
  Administered 2021-09-07 (×2): 3 [IU] via SUBCUTANEOUS
  Filled 2021-09-06: qty 0.09

## 2021-09-06 MED ORDER — INSULIN GLARGINE-YFGN 100 UNIT/ML ~~LOC~~ SOLN: 18.0000 [IU] | Freq: Every day | SUBCUTANEOUS | Status: DC

## 2021-09-06 MED ORDER — CLOPIDOGREL BISULFATE 75 MG PO TABS
75.0000 mg | ORAL_TABLET | Freq: Every day | ORAL | Status: DC
  Administered 2021-09-06: 12:00:00 75 mg via ORAL
  Filled 2021-09-06 (×4): qty 1

## 2021-09-06 MED ORDER — AMLODIPINE BESYLATE 5 MG PO TABS: 5.0000 mg | ORAL_TABLET | Freq: Every day | ORAL | Status: DC
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
## 2021-09-06 NOTE — ED Notes (Signed)
Admitting doctor at bedside 
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
## 2021-09-06 NOTE — Progress Notes (Signed)
Update: D-dimer result has returned elevated at 5.34.  Consequently, I have ordered a routine VQ scan to occur later this AM.      Babs Bertin, DO Hospitalist

## 2021-09-07 LAB — BASIC METABOLIC PANEL
Anion gap: 9 (ref 5–15)
BUN: 42 mg/dL — ABNORMAL HIGH (ref 8–23)
CO2: 22 mmol/L (ref 22–32)
Calcium: 9 mg/dL (ref 8.9–10.3)
Chloride: 104 mmol/L (ref 98–111)
Creatinine, Ser: 2.1 mg/dL — ABNORMAL HIGH (ref 0.44–1.00)
GFR, Estimated: 24 mL/min — ABNORMAL LOW (ref >60)
Glucose, Bld: 155 mg/dL — ABNORMAL HIGH (ref 70–99)
Potassium: 4.7 mmol/L (ref 3.5–5.1)
Sodium: 135 mmol/L (ref 135–145)

## 2021-09-07 LAB — BLOOD CULTURE ID PANEL (REFLEXED) - BCID2

## 2021-09-07 LAB — GLUCOSE, CAPILLARY
Glucose-Capillary: 150 mg/dL — ABNORMAL HIGH (ref 70–99)
Glucose-Capillary: 145 mg/dL — ABNORMAL HIGH (ref 70–99)
Glucose-Capillary: 209 mg/dL — ABNORMAL HIGH (ref 70–99)
Glucose-Capillary: 205 mg/dL — ABNORMAL HIGH (ref 70–99)
Glucose-Capillary: 113 mg/dL — ABNORMAL HIGH (ref 70–99)

## 2021-09-07 LAB — CBC
HCT: 27.1 % — ABNORMAL LOW (ref 36.0–46.0)
Hemoglobin: 8 g/dL — ABNORMAL LOW (ref 12.0–15.0)
MCH: 27.8 pg (ref 26.0–34.0)
MCHC: 29.5 g/dL — ABNORMAL LOW (ref 30.0–36.0)
MCV: 94.1 fL (ref 80.0–100.0)
Platelets: 206 10*3/uL (ref 150–400)
RBC: 2.88 MIL/uL — ABNORMAL LOW (ref 3.87–5.11)
RDW: 15.8 % — ABNORMAL HIGH (ref 11.5–15.5)
WBC: 10.3 10*3/uL (ref 4.0–10.5)
nRBC: 0 % (ref 0.0–0.2)

## 2021-09-07 MED ORDER — PREDNISONE 50 MG PO TABS
50.0000 mg | ORAL_TABLET | Freq: Every day | ORAL | Status: DC
  Administered 2021-09-08: 09:00:00 50 mg via ORAL
  Filled 2021-09-07: qty 1

## 2021-09-07 MED ORDER — IPRATROPIUM-ALBUTEROL 0.5-2.5 (3) MG/3ML IN SOLN
3.0000 mL | Freq: Three times a day (TID) | RESPIRATORY_TRACT | Status: DC
  Administered 2021-09-08: 08:00:00 3 mL via RESPIRATORY_TRACT
  Filled 2021-09-07: qty 3

## 2021-09-07 MED ORDER — LIDOCAINE 5 % EX PTCH
1.0000 | MEDICATED_PATCH | CUTANEOUS | Status: DC
  Administered 2021-09-07 – 2021-09-08 (×2): 1 via TRANSDERMAL
  Filled 2021-09-07 (×3): qty 1
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
## 2021-09-07 NOTE — Clinical Social Work Note (Signed)
Sabrina Hanson presents with acute on chronic hypoxemic and hypercapnic respiratory failure due to COPD. The use of the NIV will treat patient's high PC02 levels (47.0 on 08/24/21) and can reduce risk of exacerbations and future hospitalizations when used at night and during the day.  All alternate devices [phone] and [id number]) have been proven ineffective to provide essential volume control necessary to maintain acceptable CO2 levels.  An NIV with IVAPS is necessary to prevent patient from life-threatening harm.  Interruption or failure to provide NIV would quickly lead to exacerbation of the patient's condition, hospital admission, and likely harm to the patient. Furthermore, [employer]'s Breathe A Little Easier Program has shown an 85.1% readmission reduction locally for COPD patients.  Continued use is preferred.  Patient is able to protect their airways and clear secretions on their own.
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
## 2021-09-07 NOTE — Progress Notes (Signed)
PHARMACY - PHYSICIAN COMMUNICATION CRITICAL VALUE ALERT - BLOOD CULTURE IDENTIFICATION (BCID)  Sabrina Hanson is an 75 y.o. female who presented to [facility] on 09/06/2021 with a chief complaint of SOB  Assessment:  GPC, aerobic, 1/4, staph species  Name of physician (or Provider) Contacted: Clarene Essex  Current antibiotics: cefepime  Changes to prescribed antibiotics recommended:  Probable contaminant, no change  Results for orders placed or performed during the hospital encounter of 09/06/21  Blood Culture ID Panel (Reflexed) (Collected: 09/06/2021  3:52 AM)  Result Value Ref Range   Enterococcus faecalis NOT DETECTED NOT DETECTED   Enterococcus Faecium NOT DETECTED NOT DETECTED   Listeria monocytogenes NOT DETECTED NOT DETECTED   Staphylococcus species DETECTED (A) NOT DETECTED   Staphylococcus aureus (BCID) NOT DETECTED NOT DETECTED   Staphylococcus epidermidis NOT DETECTED NOT DETECTED   Staphylococcus lugdunensis NOT DETECTED NOT DETECTED   Streptococcus species NOT DETECTED NOT DETECTED   Streptococcus agalactiae NOT DETECTED NOT DETECTED   Streptococcus pneumoniae NOT DETECTED NOT DETECTED   Streptococcus pyogenes NOT DETECTED NOT DETECTED   A.calcoaceticus-baumannii NOT DETECTED NOT DETECTED   Bacteroides fragilis NOT DETECTED NOT DETECTED   Enterobacterales NOT DETECTED NOT DETECTED   Enterobacter cloacae complex NOT DETECTED NOT DETECTED   Escherichia coli NOT DETECTED NOT DETECTED   Klebsiella aerogenes NOT DETECTED NOT DETECTED   Klebsiella oxytoca NOT DETECTED NOT DETECTED   Klebsiella pneumoniae NOT DETECTED NOT DETECTED   Proteus species NOT DETECTED NOT DETECTED   Salmonella species NOT DETECTED NOT DETECTED   Serratia marcescens NOT DETECTED NOT DETECTED   Haemophilus influenzae NOT DETECTED NOT DETECTED   Neisseria meningitidis NOT DETECTED NOT DETECTED   Pseudomonas aeruginosa NOT DETECTED NOT DETECTED   Stenotrophomonas maltophilia NOT DETECTED NOT  DETECTED   Candida albicans NOT DETECTED NOT DETECTED   Candida auris NOT DETECTED NOT DETECTED   Candida glabrata NOT DETECTED NOT DETECTED   Candida krusei NOT DETECTED NOT DETECTED   Candida parapsilosis NOT DETECTED NOT DETECTED   Candida tropicalis NOT DETECTED NOT DETECTED   Cryptococcus neoformans/gattii NOT DETECTED NOT DETECTED    Dolly Rias RPh 09/07/2021, 3:42 AM

## 2021-09-07 NOTE — TOC Progression Note (Addendum)
Transition of Care [facility]) - Progression Note    Patient Details  Name: Belky Mundo MRN: [id number] Date of Birth: [birth year]  Transition of Care [facility]) CM/SW Contact  Ross Ludwig, [place] Phone Number: 09/07/2021, 2:53 PM  Clinical Narrative:     CSW attempted to contact patient's son to find out what company was supposed to deliver oxygen, nebulizer, and bipap while she was at [employer].  CSW left a message on voice mail waiting for a call back.  CSW spoke to [facility] and requested the equipment for patient.  [employer] to start working on equipment.  CSW to continue to follow patient's progress throughout discharge planning.  4:20pm  CSW spoke to [place] at [facility], patient will qualify for a Trilogy verse a BiPAP.  CSW updated attending physician.  Thedore Mins to work on Quar[employer] manager ordered for patient.  4:40pm  CSW spoke to patient and told her that CSW left a message on son 63 voicemail waiting for a call back regarding equipment.  Patient got a hold of her son and had him call this CSW.  CSW spoke to [place] to discuss DME needs and home health PT and OT services.  CSW informed him that oxygen, nebulizer and a Trilogy NIV has been ordered through [facility] and they will contact him to make arrangements for equipment delivery.  CSW informed him that usually equipment will be delivered to the patient's room before they discharge.  Patient's son is also interested in home health services, and does not have a preference for agency.  CSW was also informed that patient will be going to her son's house, the address is [address] [place], [place], [address].  CSW updated Zach at [facility] about patient's address that she is going to.  Zach at [facility] also spoke to physician and was able to get Trilogy orders signed for patient.        Expected Discharge Plan and Services                                                 Social Determinants of  Health (SDOH) Interventions    Readmission Risk Interventions No flowsheet data found.
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
## 2021-09-08 LAB — CBC
HCT: 28.4 % — ABNORMAL LOW (ref 36.0–46.0)
Hemoglobin: 8.5 g/dL — ABNORMAL LOW (ref 12.0–15.0)
MCH: 27.8 pg (ref 26.0–34.0)
MCHC: 29.9 g/dL — ABNORMAL LOW (ref 30.0–36.0)
MCV: 92.8 fL (ref 80.0–100.0)
Platelets: 221 10*3/uL (ref 150–400)
RBC: 3.06 MIL/uL — ABNORMAL LOW (ref 3.87–5.11)
RDW: 15.9 % — ABNORMAL HIGH (ref 11.5–15.5)
WBC: 8.7 10*3/uL (ref 4.0–10.5)
nRBC: 0 % (ref 0.0–0.2)

## 2021-09-08 LAB — BASIC METABOLIC PANEL
Anion gap: 8 (ref 5–15)
BUN: 54 mg/dL — ABNORMAL HIGH (ref 8–23)
CO2: 22 mmol/L (ref 22–32)
Calcium: 9 mg/dL (ref 8.9–10.3)
Chloride: 105 mmol/L (ref 98–111)
Creatinine, Ser: 2.16 mg/dL — ABNORMAL HIGH (ref 0.44–1.00)
GFR, Estimated: 23 mL/min — ABNORMAL LOW (ref >60)
Glucose, Bld: 124 mg/dL — ABNORMAL HIGH (ref 70–99)
Potassium: 4.3 mmol/L (ref 3.5–5.1)
Sodium: 135 mmol/L (ref 135–145)

## 2021-09-08 LAB — GLUCOSE, CAPILLARY
Glucose-Capillary: 126 mg/dL — ABNORMAL HIGH (ref 70–99)
Glucose-Capillary: 179 mg/dL — ABNORMAL HIGH (ref 70–99)

## 2021-09-08 LAB — CULTURE, BLOOD (ROUTINE X 2): Special Requests: ADEQUATE

## 2021-09-08 MED ORDER — CEFUROXIME AXETIL 500 MG PO TABS: 500.0000 mg | ORAL_TABLET | Freq: Two times a day (BID) | ORAL | 0 refills | Status: AC

## 2021-09-08 MED ORDER — AMLODIPINE BESYLATE 10 MG PO TABS: 10.0000 mg | ORAL_TABLET | Freq: Every day | ORAL | 0 refills | Status: DC

## 2021-09-08 MED ORDER — PREDNISONE 20 MG PO TABS: 40.0000 mg | ORAL_TABLET | Freq: Every day | ORAL | 0 refills | Status: AC

## 2021-09-08 NOTE — Progress Notes (Signed)
MD order to discharge.  D/C instructions reviewed with pt and son.  Oxygen and nebulizer explained brought into room and explained by company rep.  Both verbalize understanding of d/c instructions and DME provided.

## 2021-09-08 NOTE — TOC Transition Note (Signed)
Transition of Care [facility]) - CM/SW Discharge Note   Patient Details  Name: Sabrina Hanson MRN: [id number] Date of Birth: August 20, 1946  Transition of Care [facility]) CM/SW Contact:  Trish Mage, LCSW Phone Number: 09/08/2021, 2:29 PM   Clinical Narrative:   Patient who is stable for d/c today is in need of DME and outpt rehab.  Confirmed with patient and son that they have received O2 cannister, concentrator and nebulizer.  According to [name], [employer] has been approved by insurance and will be delivered to home later today.  She is coordinating with son. Son asks for outpatient [place]hab [place] [phone].  Referred to [facility]. No further needs identified. TOC sign off.    Final next level of care: Home/Self Care Barriers to Discharge: No Barriers Identified   Patient Goals and CMS Choice        Discharge Placement                       Discharge Plan and Services                                     Social Determinants of Health (SDOH) Interventions     Readmission Risk Interventions No flowsheet data found.

## 2021-09-10 LAB — MYCOPLASMA PNEUMONIAE ANTIBODY, IGM: Mycoplasma pneumo IgM: <770 U/mL (ref 0–769)

## 2021-09-10 NOTE — Discharge Summary (Signed)
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 performance is not FDA approved in patients less than 27 years old. Performed at [facility], [place] [address], [place], [place] [address]      Time coordinating discharge in minutes: 60  SIGNED:   Debbe Odea, MD  [facility] 09/10/2021, 5:16 PM

## 2021-09-11 LAB — CULTURE, BLOOD (ROUTINE X 2)
Culture: NO GROWTH
Special Requests: ADEQUATE

## 2021-09-14 NOTE — TOC Progression Note (Signed)
Transition of Care [facility]) - Progression Note    Patient Details  Name: Sabrina Hanson MRN: [id number] Date of Birth: 08-28-1946  Transition of Care [facility]) CM/SW Contact  Ross Ludwig,  Phone Number: 09/14/2021, 11:04 PM  Clinical Narrative:     CSW received phone call from patient's son Sabrina Hanson regarding patient's outpatient PT appointment being set up.  CSW reviewed AVS and patient was given a referral for [place] rehab clinic and patient and son needed to call to set up an appointment.  CSW provided address and phone number for the [facility] outpatient therapy clinic.  Per patient's son he left the AVS at the hospital when patient discharged on Wednesday 09/08/21.  Per patient's son, he said his mother did not get her all her medications at the pharmacy including her nebulizer medication.  CSW reviewed AVS, and there were only two medications that were new, CSW explained that the hospital physicians can not escribe medications to the pharmacy if they are meds patient is already on.  Patient's son did not have any of the old medications that patient was on, and patient lives more then two hours away.  Per patient's son, he tried contacting PCP and the PCP said they could not fill medications because they did not see patient the hospital did.    CSW advised that he take patient to one of the [facility]gent [facility], and tell them, that patient is out of refills and her pharmacy is more then 2 hours away.  CSW then informed him that physicians only have have about two hours that they can make changes to patient's discharge medications once the DC order is in.  Unfortunately patient's son was not aware that all medication can not be escribed to the local pharmacy only new meds can be escribed.  CSW apologized to patient and her son, they expressed understanding but were glad they finally know what to do in order to  get the medications.  CSW updated team leader Sabrina Hanson about the situation.   CSW signing off.    Barriers to Discharge: No Barriers Identified  Expected Discharge Plan and Services           Expected Discharge Date: 09/08/21                                     Social Determinants of Health (SDOH) Interventions    Readmission Risk Interventions No flowsheet data found.

## 2021-09-27 ENCOUNTER — Other Ambulatory Visit: Payer: Self-pay

## 2021-09-27 ENCOUNTER — Ambulatory Visit: Payer: Medicare Other | Attending: Internal Medicine

## 2021-09-27 VITALS — HR 110

## 2021-09-27 DIAGNOSIS — Z9981 Dependence on supplemental oxygen: Secondary | ICD-10-CM | POA: Insufficient documentation

## 2021-09-27 DIAGNOSIS — R2689 Other abnormalities of gait and mobility: Secondary | ICD-10-CM | POA: Insufficient documentation

## 2021-09-27 DIAGNOSIS — J9601 Acute respiratory failure with hypoxia: Secondary | ICD-10-CM | POA: Insufficient documentation

## 2021-09-27 DIAGNOSIS — S27899D Unspecified injury of other specified intrathoracic organs, subsequent encounter: Secondary | ICD-10-CM | POA: Insufficient documentation

## 2021-09-27 DIAGNOSIS — S2239XD Fracture of one rib, unspecified side, subsequent encounter for fracture with routine healing: Secondary | ICD-10-CM | POA: Diagnosis not present

## 2021-09-27 DIAGNOSIS — J449 Chronic obstructive pulmonary disease, unspecified: Secondary | ICD-10-CM | POA: Insufficient documentation

## 2021-09-27 DIAGNOSIS — M6281 Muscle weakness (generalized): Secondary | ICD-10-CM | POA: Diagnosis not present
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
## 2021-09-27 NOTE — Patient Instructions (Signed)
Access Code: [email or web address] URL: [email or web address] Date: 09/27/2021 Prepared by: Claiborne Billings  Exercises Seated Long Arc Quad - 3 x daily - 7 x weekly - 2 sets - 10 reps - 5 hold Seated March - 3 x daily - 7 x weekly - 3 sets - 10 reps Seated Heel Toe Raises - 3 x daily - 7 x weekly - 2 sets - 10 reps Seated Heel Raise - 3 x daily - 7 x weekly - 2 sets - 10 reps Seated Isometric Hip Adduction with Ball - 3 x daily - 7 x weekly - 2 sets - 10 reps

## 2021-09-30 ENCOUNTER — Encounter: Payer: Self-pay | Admitting: Physical Therapy

## 2021-09-30 ENCOUNTER — Other Ambulatory Visit: Payer: Self-pay

## 2021-09-30 ENCOUNTER — Ambulatory Visit: Payer: Medicare Other | Admitting: Physical Therapy

## 2021-09-30 DIAGNOSIS — J9601 Acute respiratory failure with hypoxia: Secondary | ICD-10-CM | POA: Diagnosis not present

## 2021-09-30 DIAGNOSIS — M6281 Muscle weakness (generalized): Secondary | ICD-10-CM

## 2021-09-30 DIAGNOSIS — R2689 Other abnormalities of gait and mobility: Secondary | ICD-10-CM
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
## 2021-09-30 NOTE — Patient Instructions (Signed)
Access Code: [email or web address] URL: [email or web address] Date: 09/30/2021 Prepared by: Venetia Night Sasha Rogel  Exercises Seated Long Arc Quad - 3 x daily - 7 x weekly - 2 sets - 10 reps - 5 hold Seated March - 3 x daily - 7 x weekly - 3 sets - 10 reps Seated Heel Toe Raises - 3 x daily - 7 x weekly - 2 sets - 10 reps Seated Heel Raise - 3 x daily - 7 x weekly - 2 sets - 10 reps Seated Isometric Hip Adduction with Ball - 3 x daily - 7 x weekly - 2 sets - 10 reps Standing Hip Abduction with Counter Support - 3 x daily - 7 x weekly - 1 sets - 5 reps Standing Hip Extension with Counter Support - 3 x daily - 7 x weekly - 1 sets - 5 reps Seated Hip Abduction with Resistance - 3 x daily - 7 x weekly - 2 sets - 10 reps Squat with Chair Touch - 3 x daily - 7 x weekly - 1 sets - 5 reps

## 2021-10-05 ENCOUNTER — Encounter: Payer: Self-pay | Admitting: Physical Therapy

## 2021-10-05 ENCOUNTER — Other Ambulatory Visit: Payer: Self-pay

## 2021-10-05 ENCOUNTER — Ambulatory Visit: Payer: Medicare Other | Admitting: Physical Therapy

## 2021-10-05 DIAGNOSIS — J9601 Acute respiratory failure with hypoxia: Secondary | ICD-10-CM | POA: Diagnosis not present

## 2021-10-05 DIAGNOSIS — M6281 Muscle weakness (generalized): Secondary | ICD-10-CM

## 2021-10-05 DIAGNOSIS — R2689 Other abnormalities of gait and mobility: Secondary | ICD-10-CM

## 2021-10-05 NOTE — Patient Instructions (Signed)
Access Code: [email or web address] URL: [email or web address] Date: 10/05/2021 Prepared by: Venetia Night Marianne Golightly  Exercises Seated Long Arc Quad - 3 x daily - 7 x weekly - 2 sets - 10 reps - 5 hold Seated March - 3 x daily - 7 x weekly - 3 sets - 10 reps Seated Heel Toe Raises - 3 x daily - 7 x weekly - 2 sets - 10 reps Seated Heel Raise - 3 x daily - 7 x weekly - 2 sets - 10 reps Seated Isometric Hip Adduction with Ball - 3 x daily - 7 x weekly - 2 sets - 10 reps Standing Hip Abduction with Counter Support - 3 x daily - 7 x weekly - 1 sets - 5 reps Standing Hip Extension with Counter Support - 3 x daily - 7 x weekly - 1 sets - 5 reps Seated Hip Abduction with Resistance - 3 x daily - 7 x weekly - 2 sets - 10 reps Squat with Chair Touch - 3 x daily - 7 x weekly - 1 sets - 5 reps Seated March with Resistance - 1 x daily - 7 x weekly - 3 sets - 10 reps Sitting Knee Extension with Resistance - 1 x daily - 7 x weekly - 3 sets - 10 reps
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
## 2021-10-07 ENCOUNTER — Ambulatory Visit: Payer: Medicare Other | Admitting: Physical Therapy

## 2021-10-07 ENCOUNTER — Other Ambulatory Visit: Payer: Self-pay

## 2021-10-07 ENCOUNTER — Encounter: Payer: Self-pay | Admitting: Physical Therapy

## 2021-10-07 DIAGNOSIS — J9601 Acute respiratory failure with hypoxia: Secondary | ICD-10-CM | POA: Diagnosis not present

## 2021-10-07 DIAGNOSIS — R2689 Other abnormalities of gait and mobility: Secondary | ICD-10-CM

## 2021-10-07 DIAGNOSIS — M6281 Muscle weakness (generalized): Secondary | ICD-10-CM
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
## 2021-10-11 ENCOUNTER — Telehealth: Payer: Self-pay

## 2021-10-11 NOTE — Telephone Encounter (Signed)
PT tried to call patient.  Unable to leave a message or reach pt.

## 2021-10-14 ENCOUNTER — Other Ambulatory Visit: Payer: Self-pay

## 2021-10-14 ENCOUNTER — Ambulatory Visit: Payer: Medicare Other

## 2021-10-14 DIAGNOSIS — J9601 Acute respiratory failure with hypoxia: Secondary | ICD-10-CM | POA: Diagnosis not present

## 2021-10-14 DIAGNOSIS — M6281 Muscle weakness (generalized): Secondary | ICD-10-CM

## 2021-10-14 DIAGNOSIS — R2689 Other abnormalities of gait and mobility: Secondary | ICD-10-CM

## 2021-10-14 NOTE — Therapy (Signed)
[place] @ [place], [place], [address] Phone: [phone]   Fax:  [phone]  Physical Therapy Treatment  Patient Details  Name: Sabrina Hanson MRN: [id number] Date of Birth: 02-25-1946 Referring Provider (PT): Debbe Odea, MD   Encounter Date: 10/14/2021   PT End of Session - 10/14/21 1142     Visit Number 5    Date for PT Re-Evaluation 11/22/21    Authorization Type Medicare KX at 15    Progress Note Due on Visit 10    PT Start Time 1101    PT Stop Time 1145    PT Time Calculation (min) 44 min    Activity Tolerance Patient tolerated treatment well    Behavior During Therapy [facility] for tasks assessed/performed             Past Medical History:  Diagnosis Date   Allergic rhinitis    COPD (chronic obstructive pulmonary disease) ([place])    Diabetes mellitus without complication ([place])    HLD (hyperlipidemia)    Hypertension    Migraine    Peripheral neuropathy     Past Surgical History:  Procedure Laterality Date   NO PAST SURGERIES      Vitals:   10/14/21 1113  SpO2: 98%     Subjective Assessment - 10/14/21 1107     Subjective Pt missed last appt.  She said she was at the [facility] for her foot.  Had nerve conduction velocity and will get results today.    Pertinent History 1.5 to 2L oxygen, weak Rt DFs- likely foot drop    Patient Stated Goals improve endurance and strength, walk without supervision, return home    Currently in Pain? No/denies                               [facility] Adult PT Treatment/Exercise - 10/14/21 0001       Ambulation/Gait   Gait Comments walked x 3.5 min with rolling walker- no rest breaks      Lumbar Exercises: Aerobic   Nustep L1 x 7.5', turned O2 up to 2L   02 checked at 5 min- 97-99%, HR 120 then reduced to 100 after 1 min rest     Lumbar Exercises: Seated   Other Seated Lumbar Exercises seated flexion march x 20      Knee/Hip Exercises:  Standing   Heel Raises Both;10 reps;2 sets    Hip Extension --    Extension Limitations --      Knee/Hip Exercises: Seated   Long Arc Quad Strengthening;Both;10 reps;2 sets;Weights    Long Arc Quad Weight 2 lbs.    Other Seated Knee/Hip Exercises seated ball squeeze 10x3", VC to not hold breath    Marching Strengthening;Both;2 sets;20 reps    Marching Limitations red band tied around knees    Sit to [place] without UE support   5 sets of 3 with 10 sec break between each round                      PT Short Term Goals - 10/05/21 1344       PT SHORT TERM GOAL #1   Title be independent in initial HEP    Status Achieved      PT SHORT TERM GOAL #2   Title perform sit to stand with minimal UE support and controlled descent due to increased LE strength  Status Achieved      PT SHORT TERM GOAL #3   Title walk > or = to 50 feet 3x/day at home to improve independence for home distances    Status Achieved               PT Long Term Goals - 09/27/21 1221       PT LONG TERM GOAL #1   Title be independent in advanced HEP    Time 8    Period Weeks    Status New    Target Date 11/22/21      PT LONG TERM GOAL #2   Title perform 5x sit to stand in < or = to 11 seconds to reduce falls risk    Time 8    Period Weeks    Status New    Target Date 11/22/21      PT LONG TERM GOAL #3   Title wean from wheelchair to walker for all distances to improve independence at home and in the community    Time 8    Period Weeks    Status New    Target Date 11/22/21      PT LONG TERM GOAL #4   Title improve LE strength to stand and walk > or = to 10-15 minutes at home and in the community to improve independence    Time 8    Period Weeks    Status New    Target Date 11/22/21      PT LONG TERM GOAL #5   Title stand for basic ADLs and self-care x 20 minutes to improve independence    Time 8    Period Weeks    Status New    Target Date 11/22/21                    Plan - 10/14/21 1121     Clinical Impression Statement Turned 02 up to 2L prior to session and 02 was monitored throughout session.   Performed more standing ther ex in parallel bars than seated ther ex today.  Pt did NuStep x 6 minutes 1 rest at 5 min,  on level 3.  HR was elevated to 120 bpm.  PT walked for 3.5 minutes without rest break.   Pt has been compliant at home with household walking 3-4x/day and HEP packet 3x daily.  Pt will continue to benefit from skilled PT to improve endurance and functional strength to allow for pt to return to independence.             Patient will benefit from skilled therapeutic intervention in order to improve the following deficits and impairments:     Visit Diagnosis: Muscle weakness (generalized)  Other abnormalities of gait and mobility     Problem List Patient Active Problem List   Diagnosis Date Noted   Acute respiratory failure with hypoxia ([place]) 09/06/2021   Bilateral pneumonia 09/06/2021   SOB (shortness of breath) 09/06/2021   COPD with acute exacerbation ([place]) 09/06/2021   Allergic rhinitis    Hypertension    HLD (hyperlipidemia)    Foot drop, right 11/01/2015   Diabetes mellitus without complication ([place]) 83/41/9622   Neuropathy 11/01/2015  Sigurd Sos, PT 10/14/21 11:48 AM   [place] @ [place], [place], [address] Phone: [phone]   Fax:  [phone]  Name: Sabrina Hanson MRN: [id number] Date of Birth: 10-03-1945

## 2021-10-19 ENCOUNTER — Ambulatory Visit: Payer: Medicare Other

## 2021-10-19 ENCOUNTER — Other Ambulatory Visit: Payer: Self-pay

## 2021-10-19 DIAGNOSIS — J9601 Acute respiratory failure with hypoxia: Secondary | ICD-10-CM | POA: Diagnosis not present

## 2021-10-19 DIAGNOSIS — R2689 Other abnormalities of gait and mobility: Secondary | ICD-10-CM

## 2021-10-19 DIAGNOSIS — M6281 Muscle weakness (generalized): Secondary | ICD-10-CM

## 2021-10-19 NOTE — Therapy (Signed)
[place] @ [place], [place], [address] Phone: [phone]   Fax:  [phone]  Physical Therapy Treatment  Patient Details  Name: Sabrina Hanson MRN: [id number] Date of Birth: Nov 16, 1945 Referring Provider (PT): Debbe Odea, MD   Encounter Date: 10/19/2021   PT End of Session - 10/19/21 1145     Visit Number 6    Date for PT Re-Evaluation 11/22/21    Authorization Type Medicare KX at 15    Progress Note Due on Visit 10    PT Start Time 1105    PT Stop Time 1145    PT Time Calculation (min) 40 min    Activity Tolerance Patient tolerated treatment well    Behavior During Therapy [facility] for tasks assessed/performed             Past Medical History:  Diagnosis Date   Allergic rhinitis    COPD (chronic obstructive pulmonary disease) ([place])    Diabetes mellitus without complication ([place])    HLD (hyperlipidemia)    Hypertension    Migraine    Peripheral neuropathy     Past Surgical History:  Procedure Laterality Date   NO PAST SURGERIES      There were no vitals filed for this visit.   Subjective Assessment - 10/19/21 1108     Subjective I've been walking around the house and doing my exercises.  I try to walk without my walker sometimes.    Pertinent History 1.5 to 2L oxygen, weak Rt DFs- likely foot drop    Patient Stated Goals improve endurance and strength, walk without supervision, return home    Currently in Pain? No/denies                               [facility] Adult PT Treatment/Exercise - 10/19/21 0001       Ambulation/Gait   Gait Comments walked from ortho gym to CR gym and back in 2 min, 44 seconds- 02 97% after      Lumbar Exercises: Aerobic   Nustep --      Lumbar Exercises: Seated   Long Arc Quad on Chair AROM;Both;10 reps;2 sets    LAQ on Chair Weights (lbs) 2      Knee/Hip Exercises: Standing   Heel Raises Both;10 reps;2 sets    Hip Abduction  AROM;Both;1 set;5 reps;Knee straight    Forward Step Up 1 set;10 reps;Step Height: 4";Hand Hold: 1    Forward Step Up Limitations 02 97-98% after each leg, rest taken between      Knee/Hip Exercises: Seated   Ball Squeeze 5"hold x 20    [place] with TheraBand Red   2x10   Hamstring Curl Strengthening;Both;2 sets;20 reps    Hamstring Limitations red loop                       PT Short Term Goals - 10/19/21 1116       PT SHORT TERM GOAL #1   Title be independent in initial HEP    Status Achieved      PT SHORT TERM GOAL #2   Title perform sit to stand with minimal UE support and controlled descent due to increased LE strength    Status Achieved      PT SHORT TERM GOAL #3   Title walk > or = to 50 feet 3x/day at home to improve independence for home  distances    Status Achieved      PT SHORT TERM GOAL #4   Title improve LE strength to wean from use of wheelchair for home distances    Status Achieved               PT Long Term Goals - 09/27/21 1221       PT LONG TERM GOAL #1   Title be independent in advanced HEP    Time 8    Period Weeks    Status New    Target Date 11/22/21      PT LONG TERM GOAL #2   Title perform 5x sit to stand in < or = to 11 seconds to reduce falls risk    Time 8    Period Weeks    Status New    Target Date 11/22/21      PT LONG TERM GOAL #3   Title wean from wheelchair to walker for all distances to improve independence at home and in the community    Time 8    Period Weeks    Status New    Target Date 11/22/21      PT LONG TERM GOAL #4   Title improve LE strength to stand and walk > or = to 10-15 minutes at home and in the community to improve independence    Time 8    Period Weeks    Status New    Target Date 11/22/21      PT LONG TERM GOAL #5   Title stand for basic ADLs and self-care x 20 minutes to improve independence    Time 8    Period Weeks    Status New    Target Date 11/22/21                    Plan - 10/19/21 1116     Clinical Impression Statement Turned 02 up to 2L prior to session and 02 was monitored throughout session.  Pt arrived using her rolling walker and looked steady with ambulation in the clinic. Pt did well with progression of exercise to include step-ups and longer periods of standing. Pt has been compliant at home with household walking 3-4x/day and HEP packet 3x daily. Pt is using her rolling walker at home for all distances. Pt will continue to benefit from skilled PT to improve endurance and functional strength to allow for pt to return to independence.    PT Frequency 2x / week    PT Duration 8 weeks    PT Treatment/Interventions ADLs/Self Care Home Management;Cryotherapy;Moist Heat;Stair training;Gait training;Functional mobility training;Therapeutic activities;Neuromuscular re-education;Therapeutic exercise;Patient/family education;Manual techniques;Passive range of motion;Energy conservation    PT Next Visit Plan continue NuStep, 3' walk test, standing>sitting ther ex, monitor O2 sat, use 2L O2 during sessions    PT Home Exercise Plan Access Code: [email or web address]    Consulted and Agree with Plan of Care Patient;Family member/caregiver    Family Member Consulted Pt's son             Patient will benefit from skilled therapeutic intervention in order to improve the following deficits and impairments:  Abnormal gait, Decreased activity tolerance, Decreased strength, Postural dysfunction, Difficulty walking, Decreased endurance, Decreased balance  Visit Diagnosis: Muscle weakness (generalized)  Other abnormalities of gait and mobility     Problem List Patient Active Problem List   Diagnosis Date Noted   Acute respiratory failure with hypoxia ([place]) 09/06/2021   Bilateral pneumonia 09/06/2021  SOB (shortness of breath) 09/06/2021   COPD with acute exacerbation ([place]) 09/06/2021   Allergic rhinitis    Hypertension    HLD (hyperlipidemia)    Foot  drop, right 11/01/2015   Diabetes mellitus without complication ([place]) 90/24/0973   Neuropathy 11/01/2015   Sigurd Sos, PT 10/19/21 11:47 AM   [facility] @ [place], [place], [address] Phone: [phone]   Fax:  [phone]  Name: Sabrina Hanson MRN: [id number] Date of Birth: Nov 23, 1945

## 2021-10-21 ENCOUNTER — Other Ambulatory Visit: Payer: Self-pay

## 2021-10-21 ENCOUNTER — Ambulatory Visit: Payer: Medicare Other | Attending: Internal Medicine | Admitting: Physical Therapy

## 2021-10-21 ENCOUNTER — Encounter: Payer: Self-pay | Admitting: Physical Therapy

## 2021-10-21 DIAGNOSIS — M6281 Muscle weakness (generalized): Secondary | ICD-10-CM | POA: Diagnosis present

## 2021-10-21 DIAGNOSIS — R2689 Other abnormalities of gait and mobility: Secondary | ICD-10-CM | POA: Insufficient documentation
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
## 2021-10-26 ENCOUNTER — Other Ambulatory Visit: Payer: Self-pay

## 2021-10-26 ENCOUNTER — Encounter: Payer: Self-pay | Admitting: Physical Therapy

## 2021-10-26 ENCOUNTER — Ambulatory Visit: Payer: Medicare Other | Admitting: Physical Therapy

## 2021-10-26 DIAGNOSIS — R2689 Other abnormalities of gait and mobility: Secondary | ICD-10-CM

## 2021-10-26 DIAGNOSIS — M6281 Muscle weakness (generalized): Secondary | ICD-10-CM | POA: Diagnosis not present
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
## 2021-10-28 ENCOUNTER — Other Ambulatory Visit: Payer: Self-pay

## 2021-10-28 ENCOUNTER — Encounter: Payer: Self-pay | Admitting: Physical Therapy

## 2021-10-28 ENCOUNTER — Ambulatory Visit: Payer: Medicare Other | Admitting: Physical Therapy

## 2021-10-28 DIAGNOSIS — M6281 Muscle weakness (generalized): Secondary | ICD-10-CM

## 2021-10-28 DIAGNOSIS — R2689 Other abnormalities of gait and mobility: Secondary | ICD-10-CM

## 2021-10-28 NOTE — Therapy (Signed)
[facility] @ [place], [place], [address] Phone: [phone]   Fax:  [phone]  Physical Therapy Treatment  Patient Details  Name: Sabrina Hanson MRN: [id number] Date of Birth: [birth year] Referring Provider (PT): Debbe Odea, MD   Encounter Date: 10/28/2021   PT End of Session - 10/28/21 1148     Visit Number 9    Date for PT Re-Evaluation 11/22/21    Authorization Type Medicare KX at 80    PT Start Time 1148    PT Stop Time 1228    PT Time Calculation (min) 40 min    Activity Tolerance Patient tolerated treatment well    Behavior During Therapy [facility] for tasks assessed/performed             Past Medical History:  Diagnosis Date   Allergic rhinitis    COPD (chronic obstructive pulmonary disease) ([place])    Diabetes mellitus without complication ([place])    HLD (hyperlipidemia)    Hypertension    Migraine    Peripheral neuropathy     Past Surgical History:  Procedure Laterality Date   NO PAST SURGERIES      There were no vitals filed for this visit.   Subjective Assessment - 10/28/21 1148     Subjective Patient presents with RW. No new complaints.    Pertinent History 1.5 to 2L oxygen, weak Rt DFs- likely foot drop    Patient Stated Goals improve endurance and strength, walk without supervision, return home    Currently in Pain? No/denies                               [facility] Adult PT Treatment/Exercise - 10/28/21 0001       Lumbar Exercises: Aerobic   Nustep L3 x8'  O2 at 2.) PT present to monitor      Lumbar Exercises: Standing   Heel Raises 10 reps    Functional Squats 10 reps    Functional Squats Limitations 2x5 reps, squat to heel raise, bil UE support      Lumbar Exercises: Seated   Long Arc Quad on Chair Strengthening;Both;2 sets;10 reps;Weights    LAQ on Chair Weights (lbs) 2      Knee/Hip Exercises: Standing   Knee Flexion Both;2 sets;10 reps    Knee  Flexion Limitations 2 #; BUE support    Hip Flexion Stengthening;Both;2 sets;20 reps;Knee bent    Hip Flexion Limitations high knee march 2lb, single UE support    Hip Abduction Stengthening;Both;1 set;10 reps;Knee straight    Abduction Limitations 2lb    Forward Step Up Both;1 set;10 reps;Hand Hold: 2;Step Height: 6"      Knee/Hip Exercises: Seated   Ball Squeeze 20x3" hold    Clamshell with TheraBand Red   20 reps   Hamstring Curl Strengthening;Both;2 sets;20 reps    Hamstring Limitations red loop    Sit to Sand 3 sets;5 reps;without UE support   feet on blue pad; red loop around thighs                      PT Short Term Goals - 10/19/21 1116       PT SHORT TERM GOAL #1   Title be independent in initial HEP    Status Achieved      PT SHORT TERM GOAL #2   Title perform sit to stand with minimal UE support and  controlled descent due to increased LE strength    Status Achieved      PT SHORT TERM GOAL #3   Title walk > or = to 50 feet 3x/day at home to improve independence for home distances    Status Achieved      PT SHORT TERM GOAL #4   Title improve LE strength to wean from use of wheelchair for home distances    Status Achieved               PT Long Term Goals - 10/26/21 1122       PT LONG TERM GOAL #1   Title be independent in advanced HEP    Status On-going      PT LONG TERM GOAL #2   Title perform 5x sit to stand in < or = to 11 seconds to reduce falls risk    Baseline 10 sec    Status Achieved      PT LONG TERM GOAL #3   Title wean from wheelchair to walker for all distances to improve independence at home and in the community    Baseline using walker or no walker for short distances    Status On-going      PT LONG TERM GOAL #4   Title improve LE strength to stand and walk > or = to 10-15 minutes at home and in the community to improve independence    Baseline stand 15', walk 5-6'    Status On-going      PT LONG TERM GOAL #5   Title  stand for basic ADLs and self-care x 20 minutes to improve independence    Status Achieved                   Plan - 10/28/21 1233     Clinical Impression Statement Pt did very well with TE today. Able to increase time on Nustep using very fast pace. Standing hip ABD challenging R > L. Minimal rest required with TE.    PT Treatment/Interventions ADLs/Self Care Home Management;Cryotherapy;Moist Heat;Stair training;Gait training;Functional mobility training;Therapeutic activities;Neuromuscular re-education;Therapeutic exercise;Patient/family education;Manual techniques;Passive range of motion;Energy conservation    PT Next Visit Plan 10th visit progress note; continue gait without walker, NuStep endurance, standing ther ex > sitting, monitor O2 sat on 1.5L             Patient will benefit from skilled therapeutic intervention in order to improve the following deficits and impairments:  Abnormal gait, Decreased activity tolerance, Decreased strength, Postural dysfunction, Difficulty walking, Decreased endurance, Decreased balance  Visit Diagnosis: Muscle weakness (generalized)  Other abnormalities of gait and mobility     Problem List Patient Active Problem List   Diagnosis Date Noted   Acute respiratory failure with hypoxia ([place]) 09/06/2021   Bilateral pneumonia 09/06/2021   SOB (shortness of breath) 09/06/2021   COPD with acute exacerbation ([place]) 09/06/2021   Allergic rhinitis    Hypertension    HLD (hyperlipidemia)    Foot drop, right 11/01/2015   Diabetes mellitus without complication ([place]) 86/38/1771   Neuropathy 11/01/2015    Madelyn Flavors, PT 10/28/2021, 12:38 PM  [facility] @ [place], [place], [address] Phone: [phone]   Fax:  [phone]  Name: Sabrina Hanson MRN: [id number] Date of Birth: January 07, 1946
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
## 2021-11-01 ENCOUNTER — Other Ambulatory Visit: Payer: Self-pay

## 2021-11-01 ENCOUNTER — Ambulatory Visit: Payer: Medicare Other

## 2021-11-01 DIAGNOSIS — M6281 Muscle weakness (generalized): Secondary | ICD-10-CM

## 2021-11-01 DIAGNOSIS — R2689 Other abnormalities of gait and mobility: Secondary | ICD-10-CM

## 2021-11-03 ENCOUNTER — Other Ambulatory Visit: Payer: Self-pay

## 2021-11-03 ENCOUNTER — Ambulatory Visit: Payer: Medicare Other

## 2021-11-03 DIAGNOSIS — M6281 Muscle weakness (generalized): Secondary | ICD-10-CM

## 2021-11-03 DIAGNOSIS — R2689 Other abnormalities of gait and mobility: Secondary | ICD-10-CM
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
## 2021-11-09 ENCOUNTER — Ambulatory Visit: Payer: Medicare Other

## 2021-11-09 ENCOUNTER — Other Ambulatory Visit: Payer: Self-pay

## 2021-11-09 DIAGNOSIS — R2689 Other abnormalities of gait and mobility: Secondary | ICD-10-CM

## 2021-11-09 DIAGNOSIS — M6281 Muscle weakness (generalized): Secondary | ICD-10-CM
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
## 2021-11-11 ENCOUNTER — Other Ambulatory Visit: Payer: Self-pay

## 2021-11-11 ENCOUNTER — Ambulatory Visit: Payer: Medicare Other

## 2021-11-11 DIAGNOSIS — R2689 Other abnormalities of gait and mobility: Secondary | ICD-10-CM

## 2021-11-11 DIAGNOSIS — M6281 Muscle weakness (generalized): Secondary | ICD-10-CM
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
## 2021-11-16 ENCOUNTER — Other Ambulatory Visit: Payer: Self-pay

## 2021-11-16 ENCOUNTER — Ambulatory Visit: Payer: Medicare Other

## 2021-11-16 DIAGNOSIS — M6281 Muscle weakness (generalized): Secondary | ICD-10-CM

## 2021-11-16 DIAGNOSIS — R2689 Other abnormalities of gait and mobility: Secondary | ICD-10-CM
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
## 2021-11-18 ENCOUNTER — Ambulatory Visit: Payer: Medicare Other | Attending: Internal Medicine

## 2021-11-18 ENCOUNTER — Other Ambulatory Visit: Payer: Self-pay

## 2021-11-18 DIAGNOSIS — M6281 Muscle weakness (generalized): Secondary | ICD-10-CM | POA: Insufficient documentation

## 2021-11-18 DIAGNOSIS — R2689 Other abnormalities of gait and mobility: Secondary | ICD-10-CM | POA: Insufficient documentation
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
## 2021-11-22 ENCOUNTER — Ambulatory Visit: Payer: Medicare Other

## 2021-11-22 ENCOUNTER — Other Ambulatory Visit: Payer: Self-pay

## 2021-11-22 DIAGNOSIS — M6281 Muscle weakness (generalized): Secondary | ICD-10-CM

## 2021-11-22 DIAGNOSIS — R2689 Other abnormalities of gait and mobility: Secondary | ICD-10-CM
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
## 2022-07-05 IMAGING — DX DG CHEST 1V PORT
1 series · 1 of 1 positions shown · non-contrast
Comparison: None.

CLINICAL DATA: Shortness of breath

EXAM:
PORTABLE CHEST 1 VIEW

[chest ap]
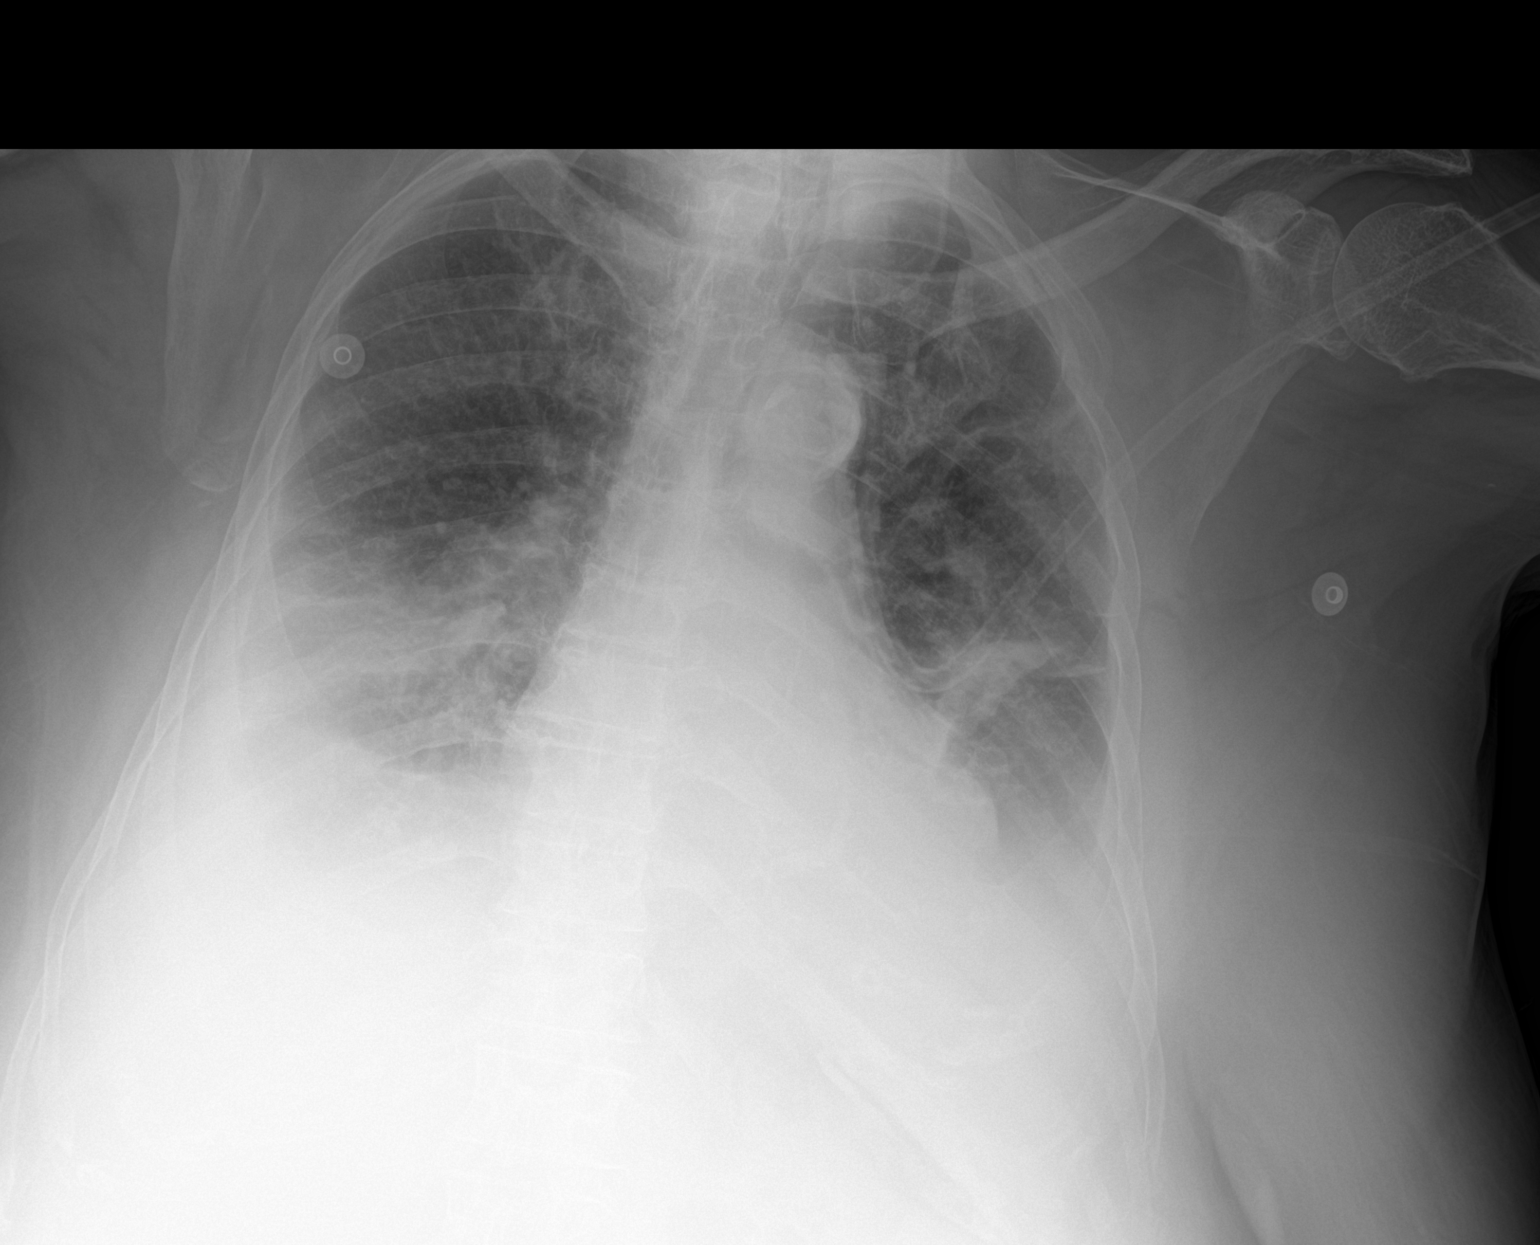

[1 of 1 positions shown; findings below may reference images not displayed]

FINDINGS: Cardiac shadow is within normal limits. Aortic calcifications are
noted. Patchy airspace opacity is noted in the bases bilaterally.
Bilateral small effusions are noted right greater than left. No bony
abnormality is noted.
IMPRESSION: Bibasilar airspace opacities with associated effusions.

## 2024-04-20 NOTE — Discharge Summary (Signed)
 [facility]      [facility] Discharge Summary   Admit Date: 04/17/2024  Discharge Date: 04/22/2024  Admitting Physician: Sabrina Andes, MD  Discharge Physician: Sabrina Sabrina Blizzard, MD  Primary Care Provider: [name], Grayce Katos, MD  Primary Cardiologist: Dr. Whitman    Discharge Destination: Son's home in [place], [place]  Discharge Services: none  Code Status: Full Code   Admission Diagnoses:  NSTEMI CABG eval  Discharge Diagnoses:  Principal Problem:   NSTEMI (non-ST elevated myocardial infarction) (CMS/HHS-HCC) Active Problems:   CAD, multiple vessel   COPD (chronic obstructive pulmonary disease) (CMS/HHS-HCC)   Type 2 diabetes mellitus (CMS/HHS-HCC)   Anemia of chronic disease   Chronic kidney disease (CKD), stage V (CMS/HHS-HCC)   Diabetic neuropathy (CMS/HHS-HCC)   Hypertension, essential   Hyperlipidemia   PAD (peripheral artery disease) ()   OSA (obstructive sleep apnea)   Right foot drop   Physical deconditioning Resolved Problems:   Hypoxia     Anticipatory Guidance (key med changes, results pending, future labs, IV therapies):   Patient transferred from [facility] for CABG evaluation. Presented to OSH with unstable angina. LHC at OSH on 04/15/24 showed severe multivessel CAD, including severe left main disease, and she was transferred to [facility] for CABG evaluation.  - Patient refusing any cardiac surgery or PCI despite multiple discussions regarding risk of death without intervention.  She is concerned that she will end up on HD sooner than later and is willing to take the risk of MI rather than risking progression of renal disease through contrast administration or surgery. She fully understood that we anticipate that she will require HD, regardless, given her advanced kidney disease. We counseled her and her son on close return precautions. They understood and agreed. Patient  signed out AMA.  - Patient agreeable to medication optimization:         - Started on Plavix  75 mg daily       - Started on ASA 81 mg daily       - Started on atorvastatin  80  mg QHS       - Increased carvedilol  to 25 mg BID for better pressure control       - Started on SL nitroglycerin  PRN       - Continue home lasix  60 mg BID       - All medications sent to [facility] on [address] in [place] per patient and son's requests. - Patient moving in with her son who lives in [place].  Appointment with Dr. Wilburn at [facility] scheduled for 8/22 at 10:30 am.  Followed with Dr. Whitman at home. - Instructions provided to call 911 if symptoms recur. - Patient to follow up with PCP for diabetes management    Cardiac Rehab: not indicated  Patient Discharge Instructions:   Ambulatory Referral to Home Health  Referral Priority: Routine Referral Type: Home Health  Requested Specialty: Home Health Services  Number of Visits Requested: 1   If you smoke (or have smoked within the last year), we strongly recommend that you do not smoke.   Weigh yourself daily and record   Low cholesterol, low fat   Notify cardiology provider of chest pain   Notify provider temperature greater than 101.0 F (38.3 C) degrees   Report questions or concerns to the [facility] at [phone])  [phone]   Notify primary care physician of other symptoms   For a life-threatening emergency, call 911   Notify provider of dizziness or passing out   Notify provider of difficulty breathing or shortness of breath   Light activity    [facility] Provider Follow-up: Future Appointments  Date Time Provider Department Center  05/10/2024 10:30 AM Alluri, Keller Grist, MD [facility] MARYL BROCKS    Non-[facility] Provider Follow-up: none  Report Issues: By using [facility], or by calling the [facility] at [phone].  For urgent issues or after business hours (after 5pm on weekdays and anytime on  weekends), call the [facility] Operator [phone] and ask to page the on-call cardiologist.     Allergies/Intolerances:  Allergies  Allergen Reactions  . Lyrica [Pregabalin] Other (See Comments)  . Oxycodone Other (See Comments)     Medications:     Discharge Medications     New Medications      Details  aspirin  81 MG EC tablet Start taking on: April 23, 2024  81 mg, Oral, Daily Quantity: 30 tablet Refills: 3   atorvastatin 80 MG tablet Commonly known as: LIPITOR Start taking on: April 23, 2024  80 mg, Oral, Daily Quantity: 30 tablet Refills: 3   clopidogreL  75 mg tablet Commonly known as: PLAVIX   75 mg, Oral, Daily Quantity: 30 tablet Refills: 3   nitroGLYcerin 0.4 MG SL tablet Commonly known as: NITROSTAT  0.4 mg, Sublingual, Every 5 min PRN, May take up to 3 doses.  Call 911 if the chest pain persists. Quantity: 25 tablet Refills: 0       Modified Medications      Details  carvediloL 25 MG tablet Commonly known as: COREG What changed:  medication strength how much to take  25 mg, Oral, 2 times Daily with meals Quantity: 60 tablet Refills: 3       Medications To Continue      Details  albuterol  MDI (PROVENTIL , VENTOLIN , PROAIR ) HFA 90 mcg/actuation inhaler  2 inhalations, Every 6 hours PRN Refills: 0   calcitRIOL 0.5 MCG capsule Commonly known as: ROCALTROL  0.5 mcg, Daily Refills: 0   cholecalciferol 1000 unit tablet  1 tablet, Daily Refills: 0   docusate 100 MG capsule Commonly known as: COLACE  100 mg, Daily Refills: 0   fluticasone propionate 50 mcg/actuation nasal spray Commonly known as: FLONASE  1 spray, Daily Refills: 0   folic acid 1 MG tablet Commonly known as: FOLVITE  1 mg, Daily Refills: 0   FUROsemide 40 MG tablet Commonly known as: LASIX  1.5 tablets, 2 times Daily Refills: 0   ipratropium-albuteroL  nebulizer solution Commonly known as: DUO-NEB  3 mLs, 4 times Daily Refills: 0   sodium  bicarbonate 650 MG tablet  650 mg, 2 times Daily Refills: 0   TRELEGY ELLIPTA 100-62.5-25 mcg inhaler Generic drug: fluticasone-umeclidinium-vilanterol  1 Puff, Daily Refills: 0       Stopped Medications    amoxicillin-clavulanate 875-125 mg tablet Commonly known as: AUGMENTIN   rosuvastatin  20 MG tablet Commonly known as: CRESTOR        Aspirin : Prescribed P2Y12-I: Prescribed High Intensity Statin: Prescribed Nitroglycerin: Prescribed ACE/ARB/ARNI: Contraindicated due to renal insufficiency/failure Beta Blocker: Prescribed MRA: Contraindicated due to renal insufficiency/failure Hydralazine /Nitrates: Not indicated SGLT2: Contraindicated due to: eGFR < 20 ml/min or ESRD on dialysis    Brief History of Present Illness: Per the H&P dated on 04/17/2024:  Sabrina Hanson is a 78 y.o. female with  past medical history significant for CKD stage V, HFpEF, HTN, COPD, T2DM, sleep apnea (not using CPAP), and chronic anemia.   Pt presented to OSH [facility]) on 7/24 for chest pain progressively worsening substernal chest pain with radiation to left arm and neck over past 2 weeks, discomfort occurring at rest.  Was found to have inferior lateral t wave changes, hypertensive crisis with a BP 201/100 and hypoxic requiring 4L [place]. Initial troponins of 16 > 231. The patient was transferred to [facility] due to continued chest pain and thoughts of need for potential catheterization, at this time troponin peaked at 738 and was placed on a heparin  gtt. The patient went for a left heart cath on 7/28 at which time significant multivessel disease was identified and transfer to [facility] was initiated for CABG evaluation and further workup.   At OSH, the patient has been having increasing oxygen needs and respiratory acidosis on ABG, currently requiring BiPAP with intermittent breaks for meals. Patient has been getting lasix for diuresis and duonebs and solumedrol for COPD exacerbation. Additionally,  the patient follows with nephrology outpatient and has been in discussions about likely need for impending dialysis. Concern for respiratory failure being secondary to need for dialysis. Patient has a known CKD stg V history and presented with a Cr 5.8, BUN 123 with a GF 7, baseline Cr is 5-6.6. She ultimately declined dialysis.   Transfer to [facility] CICU for NSTEMI and CABG evaluation.   Patient arrived to CICU in NAD on BiPAP. VS: Hr 70s SR, BP 158/77 (99).  No c/o chest pain  _____________________  Hospital Course by Problem:   #CAD w/ mutivessel disease #HTN #HFpEF Transferred from OSH for CABG workup. OSH LHC 7/29 with evidence of left main 70% ostial stenosis,  50% early mid LAD stenosis, diagonal branch has 90% ostial stenosis, ostium of the left circumflex has 60 to 70% stenosis in the mid left circumflex has 75% stenosis and proximal RCA occlusion.  Echo 7/31 with EF > 55%, CTS consulted and offered consideration of hybrid high risk PCI w/ Dr. Asenath and CABG w/ Dr. Nechama, though this would require surgery and at least two contrast administrations.  Patient declined this and chest CTA study recommended by CT surgery. She additionally declined any cath, PCI, or diagnostic tests that involve contrast given her stage 5 CKD and patient's concern that this will push her to HD sooner.  Despite multiple discussions about risks and benefits of PCI for her severe left main disease and multivessel CAD with patient, patient and son declined any coronary revascularization option despite high risk of heart attack and death.  Patient expressed understanding of her decision and signed AMA form. She also understood our expectation that she will require HD regardless. Heparin  discontinued.  Medications optimized as noted above and in medication section. They were counseled on close return precautions and urged to have a low threshold to come to the hospital. They understood and agreed.  #CKD stage  V Paitent has had GFR of 6 with Cr 5-6.6 over past 6 months and was in discussion with her nephrologist on starting dialysis soon and obtaining a AV fistula. Has not been dialysized at OSH d/t being transferred and planning of CRRT being started post CABG.  Consulted nephrology, who stated she will likely require dialysis after revascularization but she lacked acute indication for HD initiation. Patient declined surgery, PCI, and contrast studies as noted above.  Continue home calcitriol  #Hx GIB Upper endoscopy and colonoscopy  in 10/2023 revealed several benign gastric ulcers, 15 polyps removed and sent for path.  No bleeding. Continue bowel regimen with senokot.  Continue pantoprazole.  #Acute on chronic anemia #Anemia of CKD OSH Hgb of 7.7- 8.4 . Received 1 dose of EPO 5000 units at OSH.  Hgb 7.2 on 7/31; received 1u PRBC.  Iron panel w/ tsat 65. Continue folic acid supplementation. Hgb 7.8 at discharge.  Patient to follow up with nephrology for EPO injections within 1 week.  #T2DM 04/14/24 Hgba1C 7.0 at OSH. Thyroid panel WNL. Treated with sliding scale and patient to follow up with PCP for diabetes management.  #Acute Respiratory Failure w/ hypercapnia, resolved #Respiratory Acidosis, resolved #COPD #OSA Required CPAP in CCU.  Had an episode of coughing a small blood clot 8/4, per patient it is common when she feels she is coming down with a cold. Reported nasal drip.  CXR showed possible mild pulmonary edema. Patient on Lasix 60 mg BID that was recently restarted.  Extended RVP negative.  Also treated with Duonebs PRN, mucinex, home Trelegy Ellipta, flonase.    Comorbid Conditions: Nutritional Disorders:    Hypoalbuminemia:  Hypoalbuminemia present with lowest albumin  of 2.8.   Hypoalbuminemia is associated with increased risk for patients.  We will attempt to treat the underlying condition(s) contributing to this low albumin  state. Electrolyte Disorders:    Hyponatremia:  Hyponatremia  present with lowest sodium of 134.  Will continue to monitor.     Hyperkalemia:  Hyperkalemia present with highest potassium of 5.1.  Will continue to monitor.   Hematologic Disorders:    Anemia:  Anemia present with lowest hemoglobin of 7.7.  Will continue to monitor.     Thrombocytopenia:  Thrombocytopenia present with lowest platelet count of 139.  Will continue to monitor and assess for bleeding complications.          Imaging and Procedures Performed:   CXR 04/22/24 FINDINGS/IMPRESSION:   1.  Unchanged cardiomediastinal contours. 2.  Mild bilateral interstitial and linear bandlike opacities, which may reflect combination of atelectasis and mild edema. 3.  No pneumothorax or large pleural effusion.  RHC 8/1 Fick - Right Heart Catheterization State: Baseline RA: 7 mmHg (mean) RV: 38/ 7 mmHg PA: 38/ 15 20 mmHg (mean) PCW: 11 mmHg (mean) AV O2: 2.9 vol% Cardiac output: 1.9 L/min Cardiac index: 3.9 L/min-m2 PVR: 4.8 Wood units Shunt Ratio: 1.0 (Qp/Qs) L to R shunt: 0.0 L/min (Qp-Qep) R to L shunt: 0.0 L/min (Qs-Qep)   Echo 7/31 CONCLUSION ------------------------------------------------------------------------------- NORMAL LEFT VENTRICULAR SYSTOLIC FUNCTION WITH MODERATE LVH ESTIMATED EF: >55% ELEVATED LA PRESSURES WITH DIASTOLIC DYSFUNCTION (GRADE 2) NORMAL RIGHT VENTRICULAR SYSTOLIC FUNCTION VALVULAR REGURGITATION: No AR, TRIVIAL MR, TRIVIAL PR, TRIVIAL TR ESTIMATED RVSP: 29 mmHg (Normal) VALVULAR STENOSIS: No AS, MODERATE MS, No PS, No TS ------------------------------------------------------------------------------------------ MILD TO MODERATE MS IN THE SETTING OF MITRAL ANNULAR CALCIFICATION  NO PRIOR ECHO FOR COMPARISON   CXR 7/30 FINDINGS/IMPRESSION: 1.  Stable cardiac and mediastinal contours .  2.  Improving edema. No new focal pulmonary opacities. 3.  No pneumothorax or large volume pleural effusion.  _____________________  Discharge Exam:  Admission  Weight: 82.4 kg (181 lb 10.5 oz)  Discharge Weight: Weight: 74.6 kg (164 lb 8 oz) BMI: Body mass index is 30.09 kg/m. BP 137/73 (BP Location: Right upper arm, Patient Position: Sitting)   Pulse 59   Temp 36.7 C (98.1 F) (Oral)   Resp 21   Ht 157.5 cm (5' 2)   Wt 74.6 kg (164  lb 8 oz)   SpO2 95%   BMI 30.09 kg/m   General: alert, cooperative, and in NAD Respiratory: regular rate, symmetric, unlabored, clear to auscultation bilaterally, and no accessory muscle use Cardiac: regular rate, regular rhythm, S1, S2 present, no murmur, no rub, no gallop, and JVD non-elevated Abdomen: normal bowel sounds, soft, nontender, and nondistended Extremities: extremities warm and well perfused, no clubbing or cyanosis, no edema, and distal pulses intact Lines: none  Pertinent Lab Testing:  BMP: Recent Labs  Lab 04/22/24 0208  NA 134*  K 5.1*  CL 101  CO2 20*  BUN 99*  CREATININE 6.1*  GLUCOSE 125  CALCIUM  8.9  MG 2.8*   CBC: Recent Labs  Lab 04/22/24 0208  WBC 7.7  HGB 7.8*  HCT 24.4*  PLT 139*   INR: Recent Labs  Lab 04/17/24 2151  INR 1.0    TFTs: Recent Labs  Lab 04/17/24 2151  TSH 1.19  T4FREE 0.76   Troponin: No results for input(s): HSTNI, HSTNIINTERP, HSTNISYMP, DELTAHSTNI in the last 168 hours. Pro-BNP: Recent Labs  Lab 04/17/24 2151  PROBNP 3,057*    Cholesterol: Recent Labs  Lab 04/17/24 2151  CHOLTOTAL 113  HDL 44  TRIG 80   Hgb A1c: No results for input(s): HGBA1C in the last 168 hours.      Other Pertinent Labs:   Extended RVP 8/4: not detected.  _____________________  Time spent on discharge process: >30 minutes  Sabrina MARINE MAGIC, NP  Cardiology Attending  Attestation Statement:   I personally saw the patient and performed a substantive portion of the medical decision making, in conjunction with the Advanced Practice Provider for the condition/treatment of NSTEMI, multivessel CAD.  Girish Kalra, MD Assistant  Professor of Medicine, Division of Cardiology [facility]

## 2024-05-28 NOTE — Progress Notes (Signed)
 New Patient Visit    Chief Complaint: Coronary artery disease Chief Complaint  Patient presents with  . New Patient   Date of Service: 05/28/2024 Date of Birth: 09-07-1946 PCP: [name], Grayce Katos, MD  History of Present Illness: Sabrina Hanson is a 78 y.o.female patient who presented for coronary artery disease.  Past medical history significant for multivessel coronary artery disease, heart failure with preserved EF, CKD stage V, COPD on home O2, diabetes type 2, chronic anemia.  She was recently in hospital with chest discomfort/NSTEMI, left heart cath 03/2024 showed left main 70% stenosis, mid LAD 50%, diagonal 90% ostial, ostial left circumflex 70%, mid left circumflex 75% and occluded RCA.  Transferred to [facility], options of hybrid high risk PCI/CABG, high risk PCI have been discussed and patient refused to undergo coronary revascularization due to risk of dialysis and left diagnosed medical advice.  Echocardiogram 03/2024 with normal biventricular systolic function, LVEF greater than 55%, moderate LVH, mild to moderate mitral stenosis in setting of MAC.  Today patient is accompanied by son to visit.  Walks with a walker due to knee pain issues.  Has occasional mild chest discomfort with activity.  Has exertional dyspnea which appears chronic.  No palpitations, dizziness.  Past Medical and Surgical History  Past Medical History Past Medical History:  Diagnosis Date  . Anemia of chronic disease   . Chronic kidney disease, stage V (CMS/HHS-HCC)   . COPD (chronic obstructive pulmonary disease) (CMS/HHS-HCC)   . Hypercapnia   . Hyperlipidemia   . Hypertension   . Hypoxia   . Obstructive sleep apnea   . PAD (peripheral artery disease) ()   . Right foot drop   . Type 2 diabetes mellitus (CMS/HHS-HCC)     Past Surgical History She has no past surgical history on file.   Medications and Allergies  Current Medications Current Outpatient Medications  Medication Sig Dispense Refill  .  albuterol  MDI, PROVENTIL , VENTOLIN , PROAIR , HFA 90 mcg/actuation inhaler Inhale 2 inhalations into the lungs every 6 (six) hours as needed for Wheezing    . aspirin  81 MG EC tablet Take 1 tablet (81 mg total) by mouth once daily 30 tablet 3  . atorvastatin  (LIPITOR) 80 MG tablet Take 1 tablet (80 mg total) by mouth once daily 30 tablet 3  . calcitRIOL (ROCALTROL) 0.5 MCG capsule Take 0.5 mcg by mouth once daily    . carvediloL  (COREG ) 25 MG tablet Take 1 tablet (25 mg total) by mouth 2 (two) times daily with meals 60 tablet 3  . cholecalciferol 1000 unit tablet Take 1 tablet by mouth once daily    . clopidogreL  (PLAVIX ) 75 mg tablet Take 1 tablet (75 mg total) by mouth once daily 30 tablet 3  . docusate (COLACE) 100 MG capsule Take 100 mg by mouth once daily    . fluticasone propionate (FLONASE) 50 mcg/actuation nasal spray Place 1 spray into both nostrils once daily    . fluticasone-umeclidinium-vilanterol (TRELEGY ELLIPTA) 100-62.5-25 mcg inhaler Inhale 1 Puff into the lungs once daily    . folic acid (FOLVITE) 1 MG tablet Take 1 mg by mouth once daily    . FUROsemide  (LASIX ) 40 MG tablet Take 1.5 tablets by mouth 2 (two) times daily    . ipratropium-albuteroL  (DUO-NEB) nebulizer solution Take 3 mLs by nebulization 4 (four) times daily    . nitroGLYcerin  (NITROSTAT ) 0.4 MG SL tablet Place 1 tablet (0.4 mg total) under the tongue every 5 (five) minutes as needed for Chest pain May  take up to 3 doses.  Call 911 if the chest pain persists. 25 tablet 0  . sodium bicarbonate 650 MG tablet Take 650 mg by mouth 2 (two) times daily    . isosorbide mononitrate (IMDUR) 30 MG ER tablet Take 1 tablet (30 mg total) by mouth once daily 30 tablet 11   No current facility-administered medications for this visit.    Allergies Lyrica [pregabalin] and Oxycodone  Social and Family History  Social History  reports that she quit smoking about 20 months ago. Her smoking use included cigarettes. She started  smoking about 55 years ago. She has a 27 pack-year smoking history. She has never used smokeless tobacco. She reports that she does not use drugs.  Family History family history is not on file.   Review of Systems   Review of Systems: The patient denies chest pain, shortness of breath, orthopnea, paroxysmal nocturnal dyspnea, pedal edema, palpitations, heart racing, presyncope, syncope.    Physical Examination   Vitals:BP (!) 150/100 (BP Location: Left upper arm, Patient Position: Sitting, BP Cuff Size: Small Adult)   Ht 157.5 cm (5' 2)   Wt 73.5 kg (162 lb)   BMI 29.63 kg/m  Ht:157.5 cm (5' 2) Wt:73.5 kg (162 lb) ADJ:Anib surface area is 1.79 meters squared. Body mass index is 29.63 kg/m.  HEENT: Pupils equally reactive to light and accomodation  Neck: Supple, no significant JVD Lungs: clear to auscultation bilaterally; no wheezes, rales, rhonchi Heart: Regular rate and rhythm. No murmur Extremities: Trace pedal edema  Assessment and Plan   78 y.o. female with  Multivessel coronary artery disease including left main disease Mild to moderate mitral stenosis in setting of MAC Hypertension Hyperlipidemia CKD stage V COPD Limited functional status  Patient with recent NSTEMI, cath showing multivessel CAD.  Evaluated at [facility] and she refused any revascularization options due to risk of dialysis. Discussed with patient again today and she only wants to continue medical management. Will start Imdur 30 mg daily. Continue aspirin  and Plavix . Continue atorvastatin Continue carvedilol and Lasix She follows with nephrology as well  No orders of the defined types were placed in this encounter.   Return in about 3 months (around 08/27/2024).  KRISHNA CHAITANYA ALLURI, MD  This dictation was prepared with dragon dictation. Any transcription errors that result from this process are unintentional.

## 2024-08-18 ENCOUNTER — Other Ambulatory Visit: Payer: Self-pay

## 2024-08-18 ENCOUNTER — Encounter (HOSPITAL_COMMUNITY): Payer: Self-pay

## 2024-08-18 ENCOUNTER — Emergency Department (HOSPITAL_COMMUNITY)

## 2024-08-18 ENCOUNTER — Inpatient Hospital Stay (HOSPITAL_COMMUNITY)
Admission: EM | Admit: 2024-08-18 | Discharge: 2024-08-20 | DRG: 281 | Disposition: A | Attending: Family Medicine | Admitting: Family Medicine

## 2024-08-18 DIAGNOSIS — I200 Unstable angina: Principal | ICD-10-CM

## 2024-08-18 DIAGNOSIS — I214 Non-ST elevation (NSTEMI) myocardial infarction: Secondary | ICD-10-CM | POA: Diagnosis present

## 2024-08-18 DIAGNOSIS — D649 Anemia, unspecified: Secondary | ICD-10-CM | POA: Diagnosis present

## 2024-08-18 DIAGNOSIS — I5032 Chronic diastolic (congestive) heart failure: Secondary | ICD-10-CM | POA: Diagnosis present

## 2024-08-18 DIAGNOSIS — J449 Chronic obstructive pulmonary disease, unspecified: Secondary | ICD-10-CM | POA: Diagnosis present

## 2024-08-18 DIAGNOSIS — J9611 Chronic respiratory failure with hypoxia: Secondary | ICD-10-CM | POA: Diagnosis present

## 2024-08-18 DIAGNOSIS — E872 Acidosis, unspecified: Secondary | ICD-10-CM | POA: Diagnosis present

## 2024-08-18 DIAGNOSIS — N185 Chronic kidney disease, stage 5: Secondary | ICD-10-CM | POA: Diagnosis present

## 2024-08-18 DIAGNOSIS — E119 Type 2 diabetes mellitus without complications: Secondary | ICD-10-CM

## 2024-08-18 LAB — CBC
HCT: 30.2 % — ABNORMAL LOW (ref 36.0–46.0)
Hemoglobin: 8.7 g/dL — ABNORMAL LOW (ref 12.0–15.0)
MCH: 26.4 pg (ref 26.0–34.0)
MCHC: 28.8 g/dL — ABNORMAL LOW (ref 30.0–36.0)
MCV: 91.5 fL (ref 80.0–100.0)
Platelets: 192 K/uL (ref 150–400)
RBC: 3.3 MIL/uL — ABNORMAL LOW (ref 3.87–5.11)
RDW: 15.9 % — ABNORMAL HIGH (ref 11.5–15.5)
WBC: 5.3 K/uL (ref 4.0–10.5)
nRBC: 0 % (ref 0.0–0.2)

## 2024-08-18 LAB — BASIC METABOLIC PANEL WITH GFR
Anion gap: 7 (ref 5–15)
BUN: 52 mg/dL — ABNORMAL HIGH (ref 8–23)
CO2: 15 mmol/L — ABNORMAL LOW (ref 22–32)
Calcium: 8.4 mg/dL — ABNORMAL LOW (ref 8.9–10.3)
Chloride: 114 mmol/L — ABNORMAL HIGH (ref 98–111)
Creatinine, Ser: 5.02 mg/dL — ABNORMAL HIGH (ref 0.44–1.00)
GFR, Estimated: 8 mL/min — ABNORMAL LOW (ref >60)
Glucose, Bld: 251 mg/dL — ABNORMAL HIGH (ref 70–99)
Potassium: 5.2 mmol/L — ABNORMAL HIGH (ref 3.5–5.1)
Sodium: 136 mmol/L (ref 135–145)

## 2024-08-18 LAB — TROPONIN I (HIGH SENSITIVITY): Troponin I (High Sensitivity): 17 ng/L (ref <18)

## 2024-08-18 MED ORDER — NITROGLYCERIN 2 % TD OINT
1.0000 [in_us] | TOPICAL_OINTMENT | Freq: Once | TRANSDERMAL | Status: AC
  Administered 2024-08-18: 1 [in_us] via TOPICAL
  Filled 2024-08-18: qty 1

## 2024-08-18 NOTE — ED Provider Notes (Signed)
  Physical Exam  BP (!) 159/71   Pulse 81   Temp 98.3 F (36.8 C) (Oral)   Resp 20   Ht 5' 2 (1.575 m)   Wt 73.5 kg   SpO2 98%   BMI 29.63 kg/m   Physical Exam  Procedures  Procedures  ED Course / MDM    Medical Decision Making Amount and/or Complexity of Data Reviewed Labs: ordered. Radiology: ordered.  Risk Prescription drug management. Decision regarding hospitalization.   64F with hx of NSTEMI, previously admitted to [facility], refused bypass and PCI and went with medical management. Now presenting with CP, improved with Nitroglycerin. Subtle ST changes, waiting on cardiac troponins, likely admission to medicine her (discussed with on-call cardiology).   Labs: Initial cardiac troponin 17, repeat mildly elevated at 35, will continue to trend.  Patient with creatinine at 5, during her recent hospitalization at [facility] had been as high as 6.  According to outpatient notes: She was recently in hospital with chest discomfort/NSTEMI, left heart cath 03/2024 showed left main 70% stenosis, mid LAD 50%, diagonal 90% ostial, ostial left circumflex 70%, mid left circumflex 75% and occluded RCA. Transferred to [facility], options of hybrid high risk PCI/CABG, high risk PCI have been discussed and patient refused to undergo coronary revascularization due to risk of dialysis and left diagnosed medical advice. Echocardiogram 03/2024 with normal biventricular systolic function, LVEF greater than 55%, moderate LVH, mild to moderate mitral stenosis in setting of MAC.  In the setting of ongoing chest pain with known CAD, cardiology had been consulted by the previous divider, recommended medicine admission, Dr. Charlton accepting.     Jerrol Agent, MD 08/19/24 [phone]

## 2024-08-18 NOTE — ED Provider Notes (Signed)
 [facility] EMERGENCY DEPARTMENT AT [facility] Provider Note   CSN: [id number] Arrival date & time: 08/18/24  2213     Patient presents with: Chest Pain   Sabrina Hanson is a 78 y.o. female.   HPI Patient reports that she is scheduled to get a stent next month.  She was admitted for an NSTEMI and diagnosed with coronary artery disease multiple vessel.  She was evaluated at [facility] cardiology for CABG and PCI.  At that time patient refused intervention due to concerns for loss of renal function with IV contrast.  She opted for medical management despite being counseled that if she had significant multi vessel disease and had significant risk of MI.  Patient reports that she got chest pain tonight she had been watching TV and felt fine and then stood up and moved around a little bit and then she had a lot of chest pressure.  She tried some Tylenol  but did not get any relief.  She ended up calling EMS.  She reports EMS administered 2 nitroglycerin and that took care of that she feels a lot better now.  She reports that at the time that she had the chest pain she felt short of breath but now it is improved.  He was given aspirin  on route.      Prior to Admission medications   Medication Sig Start Date End Date Taking? Authorizing Provider  amLODipine  (NORVASC ) 10 MG tablet Take 1 tablet (10 mg total) by mouth daily. 09/09/21 10/09/21  Rizwan, Saima, MD  aspirin  81 MG EC tablet Take 81 mg by mouth daily.    [provider]  azelastine (ASTELIN) 0.1 % nasal spray Place 2 sprays into both nostrils 2 (two) times daily. 09/30/19   [provider]  benzonatate (TESSALON) 100 MG capsule Take 200 mg by mouth 3 (three) times daily as needed for cough. 06/28/21   [provider]  calcitRIOL (ROCALTROL) 0.25 MCG capsule Take 0.25 mcg by mouth every other day. 06/07/21   [provider]  CETIRIZINE HCL PO Take 10 mg by mouth daily.     [provider]   clopidogrel  (PLAVIX ) 75 MG tablet Take 75 mg by mouth daily. 07/13/21   [provider]  docusate sodium (COLACE) 100 MG capsule Take 100 mg by mouth daily as needed for mild constipation.    [provider]  famotidine (PEPCID) 10 MG tablet Take 10 mg by mouth daily. 09/04/21 09/19/21  [provider]  gabapentin  (NEURONTIN ) 100 MG capsule Take 100 mg by mouth at bedtime. 09/03/21 09/14/21  [provider]  glimepiride (AMARYL) 1 MG tablet Take 1 mg by mouth daily. 07/13/21   [provider]  guaiFENesin (MUCINEX) 600 MG 12 hr tablet Take 600 mg by mouth 2 (two) times daily as needed for cough or to loosen phlegm.    [provider]  LEVEMIR FLEXTOUCH 100 UNIT/ML FlexTouch Pen Inject 18 Units into the skin at bedtime. 04/05/21   [provider]  melatonin 3 MG TABS tablet Take 3 mg by mouth at bedtime as needed (sleep).    [provider]  rosuvastatin  (CRESTOR ) 10 MG tablet Take 10 mg by mouth at bedtime. 09/03/21   [provider]  VENTOLIN  HFA 108 (90 Base) MCG/ACT inhaler Inhale 2 puffs into the lungs every 6 (six) hours as needed for shortness of breath or wheezing. 09/03/21   [provider]    Allergies: Oxycodone and Pregabalin  Review of Systems  Updated Vital Signs BP (!) 159/71   Pulse 81   Temp 98.3 F (36.8 C) (Oral)   Resp 20   Ht 5' 2 (1.575 m)   Wt 73.5 kg   SpO2 98%   BMI 29.63 kg/m   Physical Exam Constitutional:      Comments: Alert nontoxic.  No respiratory distress.  Clear mental status.  HENT:     Mouth/Throat:     Pharynx: Oropharynx is clear.  Eyes:     Extraocular Movements: Extraocular movements intact.  Cardiovascular:     Rate and Rhythm: Normal rate and regular rhythm.  Pulmonary:     Effort: Pulmonary effort is normal.     Breath sounds: Normal breath sounds.  Abdominal:     General: There is no distension.     Palpations: Abdomen is soft.      Tenderness: There is no abdominal tenderness. There is no guarding.  Musculoskeletal:        General: No swelling or tenderness. Normal range of motion.     Right lower leg: No edema.     Left lower leg: No edema.  Skin:    General: Skin is warm and dry.  Neurological:     General: No focal deficit present.     Mental Status: She is oriented to person, place, and time.     Motor: No weakness.  Psychiatric:        Mood and Affect: Mood normal.     (all labs ordered are listed, but only abnormal results are displayed) Labs Reviewed  CBC - Abnormal; Notable for the following components:      Result Value   RBC 3.30 (*)    Hemoglobin 8.7 (*)    HCT 30.2 (*)    MCHC 28.8 (*)    RDW 15.9 (*)    All other components within normal limits  BASIC METABOLIC PANEL WITH GFR  TROPONIN I (HIGH SENSITIVITY)    EKG: EKG Interpretation Date/Time:  Sunday August 18 2024 22:19:31 EST Ventricular Rate:  84 PR Interval:  188 QRS Duration:  79 QT Interval:  394 QTC Calculation: 466 R Axis:   21  Text Interpretation: Sinus rhythm Borderline repolarization abnormality no STEMI, subtle ST depression V4V5 compared to first previous Confirmed by Armenta Canning [phone]) on 08/18/2024 10:24:21 PM  Radiology: DG Chest Portable 1 View Result Date: 08/18/2024 EXAM: 1 VIEW(S) XRAY OF THE CHEST 08/18/2024 10:38:00 PM COMPARISON: 04/15/2024 CLINICAL HISTORY: Chest Pain FINDINGS: LUNGS AND PLEURA: Glenoid airspace opacity in the left mid lung likely represents atelectasis or scarring. No pleural effusion. No pneumothorax. HEART AND MEDIASTINUM: Aortic calcification. No acute abnormality of the cardiac silhouette. BONES AND SOFT TISSUES: No acute osseous abnormality. IMPRESSION: 1. No acute cardiopulmonary findings. Electronically signed by: Norman Gatlin MD 08/18/2024 10:56 PM EST RP Workstation: [id number]     Procedures   Medications Ordered in the ED  nitroGLYCERIN (NITROGLYN) 2 % ointment 1  inch (1 inch Topical Given 08/18/24 2248)                                    Medical Decision Making Amount and/or Complexity of Data Reviewed Labs: ordered. Radiology: ordered.   Patient presents as outlined.  She has pre-existing multivessel coronary artery disease.  Findings are consistent with unstable angina.  Patient did get relief with nitroglycerin.  At this time she is pain-free.  EKG is reviewed by myself shows some subtle ST depression compared to our prior tracings.  Will apply Nitropaste.  Patient had aspirin  prior to arrival.  I discussed this situation of her pursuing treatment with the [facility] healthcare system.  Her plan was to be followed up in [place] at the [facility] clinic and apparently they would do a cardiac catheterization and PCI in [place].  At this time the patient is undecided if he wishes to get admitted here and proceed with a different cardiology group.  She will be reviewing this with family members.  Consult: Reviewed with Dr. Debarah cardiology fellow.  Patient could be admitted to medical service and ruled out.  Can consult cardiology as needed.   Dr. Jerrol to follow-up on lab results and admission plan     Final diagnoses:  Unstable angina [facility])    ED Discharge Orders     None          Armenta Canning, MD 08/18/24 2311

## 2024-08-18 NOTE — ED Triage Notes (Signed)
 Pt BIB [facility] from Home c/o gradual onset centralized chest pain that is radiating down the left arm. PT is scheduled for a stent placement next month. Pt received 324 ASA and 2 0.4 SL nitroglycerin in route. Lung sounds clear per EMS.  EMS Vitals: 166/96 BP Sinus 90's 99% 2L for comfort

## 2024-08-19 ENCOUNTER — Encounter (HOSPITAL_COMMUNITY): Payer: Self-pay | Admitting: Family Medicine

## 2024-08-19 DIAGNOSIS — Z888 Allergy status to other drugs, medicaments and biological substances status: Secondary | ICD-10-CM | POA: Diagnosis not present

## 2024-08-19 DIAGNOSIS — D649 Anemia, unspecified: Secondary | ICD-10-CM | POA: Diagnosis present

## 2024-08-19 DIAGNOSIS — E872 Acidosis, unspecified: Secondary | ICD-10-CM | POA: Diagnosis present

## 2024-08-19 DIAGNOSIS — I214 Non-ST elevation (NSTEMI) myocardial infarction: Secondary | ICD-10-CM | POA: Diagnosis present

## 2024-08-19 DIAGNOSIS — I5032 Chronic diastolic (congestive) heart failure: Secondary | ICD-10-CM | POA: Diagnosis present

## 2024-08-19 DIAGNOSIS — J9611 Chronic respiratory failure with hypoxia: Secondary | ICD-10-CM | POA: Diagnosis present

## 2024-08-19 DIAGNOSIS — Z794 Long term (current) use of insulin: Secondary | ICD-10-CM | POA: Diagnosis not present

## 2024-08-19 DIAGNOSIS — Z7902 Long term (current) use of antithrombotics/antiplatelets: Secondary | ICD-10-CM | POA: Diagnosis not present

## 2024-08-19 DIAGNOSIS — I132 Hypertensive heart and chronic kidney disease with heart failure and with stage 5 chronic kidney disease, or end stage renal disease: Secondary | ICD-10-CM | POA: Diagnosis present

## 2024-08-19 DIAGNOSIS — Z885 Allergy status to narcotic agent status: Secondary | ICD-10-CM | POA: Diagnosis not present

## 2024-08-19 DIAGNOSIS — N185 Chronic kidney disease, stage 5: Secondary | ICD-10-CM | POA: Diagnosis present

## 2024-08-19 DIAGNOSIS — Z7982 Long term (current) use of aspirin: Secondary | ICD-10-CM | POA: Diagnosis not present

## 2024-08-19 DIAGNOSIS — E785 Hyperlipidemia, unspecified: Secondary | ICD-10-CM | POA: Diagnosis present

## 2024-08-19 DIAGNOSIS — G43909 Migraine, unspecified, not intractable, without status migrainosus: Secondary | ICD-10-CM | POA: Diagnosis present

## 2024-08-19 DIAGNOSIS — E669 Obesity, unspecified: Secondary | ICD-10-CM | POA: Diagnosis present

## 2024-08-19 DIAGNOSIS — I10 Essential (primary) hypertension: Secondary | ICD-10-CM | POA: Diagnosis not present

## 2024-08-19 DIAGNOSIS — I5030 Unspecified diastolic (congestive) heart failure: Secondary | ICD-10-CM

## 2024-08-19 DIAGNOSIS — R079 Chest pain, unspecified: Secondary | ICD-10-CM | POA: Diagnosis present

## 2024-08-19 DIAGNOSIS — F17200 Nicotine dependence, unspecified, uncomplicated: Secondary | ICD-10-CM | POA: Diagnosis present

## 2024-08-19 DIAGNOSIS — Z7984 Long term (current) use of oral hypoglycemic drugs: Secondary | ICD-10-CM | POA: Diagnosis not present

## 2024-08-19 DIAGNOSIS — E119 Type 2 diabetes mellitus without complications: Secondary | ICD-10-CM

## 2024-08-19 DIAGNOSIS — J449 Chronic obstructive pulmonary disease, unspecified: Secondary | ICD-10-CM | POA: Diagnosis present

## 2024-08-19 DIAGNOSIS — I2510 Atherosclerotic heart disease of native coronary artery without angina pectoris: Secondary | ICD-10-CM | POA: Diagnosis present

## 2024-08-19 DIAGNOSIS — Z9981 Dependence on supplemental oxygen: Secondary | ICD-10-CM | POA: Diagnosis not present

## 2024-08-19 DIAGNOSIS — D631 Anemia in chronic kidney disease: Secondary | ICD-10-CM | POA: Diagnosis present

## 2024-08-19 DIAGNOSIS — E1122 Type 2 diabetes mellitus with diabetic chronic kidney disease: Secondary | ICD-10-CM | POA: Diagnosis present

## 2024-08-19 DIAGNOSIS — E875 Hyperkalemia: Secondary | ICD-10-CM | POA: Diagnosis present

## 2024-08-19 DIAGNOSIS — Z79899 Other long term (current) drug therapy: Secondary | ICD-10-CM | POA: Diagnosis not present

## 2024-08-19 DIAGNOSIS — I050 Rheumatic mitral stenosis: Secondary | ICD-10-CM | POA: Diagnosis present

## 2024-08-19 LAB — LIPID PANEL
Cholesterol: 173 mg/dL (ref 0–200)
HDL: 44 mg/dL (ref >40)
LDL Cholesterol: 121 mg/dL — ABNORMAL HIGH (ref 0–99)
Total CHOL/HDL Ratio: 3.9 ratio
Triglycerides: 39 mg/dL (ref <150)
VLDL: 8 mg/dL (ref 0–40)

## 2024-08-19 LAB — BASIC METABOLIC PANEL WITH GFR
Anion gap: 7 (ref 5–15)
BUN: 51 mg/dL — ABNORMAL HIGH (ref 8–23)
CO2: 17 mmol/L — ABNORMAL LOW (ref 22–32)
Calcium: 8.2 mg/dL — ABNORMAL LOW (ref 8.9–10.3)
Chloride: 115 mmol/L — ABNORMAL HIGH (ref 98–111)
Creatinine, Ser: 4.92 mg/dL — ABNORMAL HIGH (ref 0.44–1.00)
GFR, Estimated: 9 mL/min — ABNORMAL LOW (ref >60)
Glucose, Bld: 90 mg/dL (ref 70–99)
Potassium: 5.1 mmol/L (ref 3.5–5.1)
Sodium: 139 mmol/L (ref 135–145)

## 2024-08-19 LAB — CBC
HCT: 26.3 % — ABNORMAL LOW (ref 36.0–46.0)
Hemoglobin: 7.8 g/dL — ABNORMAL LOW (ref 12.0–15.0)
MCH: 26.3 pg (ref 26.0–34.0)
MCHC: 29.7 g/dL — ABNORMAL LOW (ref 30.0–36.0)
MCV: 88.6 fL (ref 80.0–100.0)
Platelets: 195 K/uL (ref 150–400)
RBC: 2.97 MIL/uL — ABNORMAL LOW (ref 3.87–5.11)
RDW: 15.8 % — ABNORMAL HIGH (ref 11.5–15.5)
WBC: 5.4 K/uL (ref 4.0–10.5)
nRBC: 0 % (ref 0.0–0.2)

## 2024-08-19 LAB — GLUCOSE, CAPILLARY
Glucose-Capillary: 95 mg/dL (ref 70–99)
Glucose-Capillary: 93 mg/dL (ref 70–99)
Glucose-Capillary: 134 mg/dL — ABNORMAL HIGH (ref 70–99)
Glucose-Capillary: 97 mg/dL (ref 70–99)
Glucose-Capillary: 157 mg/dL — ABNORMAL HIGH (ref 70–99)
Glucose-Capillary: 116 mg/dL — ABNORMAL HIGH (ref 70–99)

## 2024-08-19 LAB — TROPONIN I (HIGH SENSITIVITY)
Troponin I (High Sensitivity): 35 ng/L — ABNORMAL HIGH (ref <18)
Troponin I (High Sensitivity): 69 ng/L — ABNORMAL HIGH (ref <18)
Troponin I (High Sensitivity): 97 ng/L — ABNORMAL HIGH (ref <18)

## 2024-08-19 LAB — HEMOGLOBIN A1C
Hgb A1c MFr Bld: 5.8 % — ABNORMAL HIGH (ref 4.8–5.6)
Mean Plasma Glucose: 120 mg/dL

## 2024-08-19 MED ORDER — ASPIRIN 81 MG PO TBEC
81.0000 mg | DELAYED_RELEASE_TABLET | Freq: Every day | ORAL | Status: DC
  Administered 2024-08-19 – 2024-08-20 (×2): 81 mg via ORAL
  Filled 2024-08-19 (×2): qty 1

## 2024-08-19 MED ORDER — IPRATROPIUM-ALBUTEROL 0.5-2.5 (3) MG/3ML IN SOLN: 3.0000 mL | Freq: Four times a day (QID) | RESPIRATORY_TRACT | Status: DC | PRN

## 2024-08-19 MED ORDER — HEPARIN (PORCINE) 25000 UT/250ML-% IV SOLN
850.0000 [IU]/h | INTRAVENOUS | Status: DC
  Administered 2024-08-19: 850 [IU]/h via INTRAVENOUS
  Filled 2024-08-19: qty 250

## 2024-08-19 MED ORDER — CARVEDILOL 25 MG PO TABS
25.0000 mg | ORAL_TABLET | Freq: Two times a day (BID) | ORAL | Status: DC
  Administered 2024-08-19 – 2024-08-20 (×3): 25 mg via ORAL
  Filled 2024-08-19 (×3): qty 1

## 2024-08-19 MED ORDER — BUDESON-GLYCOPYRROL-FORMOTEROL 160-9-4.8 MCG/ACT IN AERO
2.0000 | INHALATION_SPRAY | Freq: Two times a day (BID) | RESPIRATORY_TRACT | Status: DC
  Administered 2024-08-20: 2 via RESPIRATORY_TRACT
  Filled 2024-08-19 (×2): qty 5.9

## 2024-08-19 MED ORDER — ACETAMINOPHEN 325 MG PO TABS
650.0000 mg | ORAL_TABLET | ORAL | Status: DC | PRN
  Administered 2024-08-19: 650 mg via ORAL
  Filled 2024-08-19: qty 2

## 2024-08-19 MED ORDER — CLOPIDOGREL BISULFATE 75 MG PO TABS
75.0000 mg | ORAL_TABLET | Freq: Every day | ORAL | Status: DC
  Administered 2024-08-19 – 2024-08-20 (×2): 75 mg via ORAL
  Filled 2024-08-19 (×2): qty 1

## 2024-08-19 MED ORDER — HEPARIN BOLUS VIA INFUSION
4000.0000 [IU] | Freq: Once | INTRAVENOUS | Status: AC
  Administered 2024-08-19: 4000 [IU] via INTRAVENOUS
  Filled 2024-08-19: qty 4000

## 2024-08-19 MED ORDER — NITROGLYCERIN 0.4 MG SL SUBL
0.4000 mg | SUBLINGUAL_TABLET | SUBLINGUAL | Status: DC | PRN
  Administered 2024-08-19: 0.4 mg via SUBLINGUAL
  Filled 2024-08-19: qty 1

## 2024-08-19 MED ORDER — FENTANYL CITRATE (PF) 50 MCG/ML IJ SOSY: 25.0000 ug | PREFILLED_SYRINGE | INTRAMUSCULAR | Status: DC | PRN

## 2024-08-19 MED ORDER — SODIUM BICARBONATE 650 MG PO TABS
650.0000 mg | ORAL_TABLET | Freq: Two times a day (BID) | ORAL | Status: DC
  Administered 2024-08-19 – 2024-08-20 (×3): 650 mg via ORAL
  Filled 2024-08-19 (×3): qty 1

## 2024-08-19 MED ORDER — SODIUM CHLORIDE 0.9 % IV BOLUS
1000.0000 mL | Freq: Once | INTRAVENOUS | Status: AC
  Administered 2024-08-19: 1000 mL via INTRAVENOUS

## 2024-08-19 MED ORDER — ATORVASTATIN CALCIUM 80 MG PO TABS
80.0000 mg | ORAL_TABLET | Freq: Every day | ORAL | Status: DC
  Administered 2024-08-19 – 2024-08-20 (×2): 80 mg via ORAL
  Filled 2024-08-19 (×2): qty 1

## 2024-08-19 MED ORDER — ISOSORBIDE MONONITRATE ER 30 MG PO TB24
30.0000 mg | ORAL_TABLET | Freq: Every day | ORAL | Status: DC
  Administered 2024-08-19 – 2024-08-20 (×2): 30 mg via ORAL
  Filled 2024-08-19 (×2): qty 1

## 2024-08-19 MED ORDER — INSULIN ASPART 100 UNIT/ML IJ SOLN
0.0000 [IU] | INTRAMUSCULAR | Status: DC
  Filled 2024-08-19: qty 1

## 2024-08-19 MED ORDER — INSULIN ASPART 100 UNIT/ML IJ SOLN
0.0000 [IU] | Freq: Three times a day (TID) | INTRAMUSCULAR | Status: DC
  Administered 2024-08-20: 3 [IU] via SUBCUTANEOUS
  Filled 2024-08-19: qty 3

## 2024-08-19 MED ORDER — FUROSEMIDE 40 MG PO TABS
60.0000 mg | ORAL_TABLET | Freq: Two times a day (BID) | ORAL | Status: DC
  Administered 2024-08-19 – 2024-08-20 (×3): 60 mg via ORAL
  Filled 2024-08-19 (×3): qty 1

## 2024-08-19 MED ORDER — ONDANSETRON HCL 4 MG/2ML IJ SOLN: 4.0000 mg | Freq: Four times a day (QID) | INTRAMUSCULAR | Status: DC | PRN

## 2024-08-19 NOTE — H&P (Signed)
 History and Physical    Sabrina Hanson DOB: 1945-10-13 DOA: 08/18/2024  PCP: Barba [name], MD   Patient coming from: Home   Chief Complaint: Chest pain   HPI: Sabrina Hanson is a 78 y.o. female with medical history significant for hypertension, hyperlipidemia, type 2 diabetes mellitus, COPD, chronic hypoxic respiratory failure, chronic HFpEF, CKD stage V, and multivessel CAD managed medically who presents with chest pain.  Patient reports that she has been experiencing intermittent chest discomfort for months but symptoms were much more intense last night, prompting her to call EMS.  She was at rest when she developed intense pressure sensation in her chest with left arm discomfort and dyspnea.  She took Tylenol  at home without improvement and called EMS.  She was treated with 324 mg aspirin  and nitroglycerin  x 2 prior to arrival in the ED.  She reports improvement in her symptoms with nitroglycerin .  She denies any nausea, vomiting, diuresis, recent cough, fevers, or chills.  She had been admitted to a [facility] hospital in [place] in late July 2025 with NSTEMI, was found to have multivessel coronary artery disease, and she was transferred to [facility] for CABG evaluation at that time.  At [facility], she refused cardiac surgery or PCI despite multiple discussions, feeling that the risk of progressing to ESRD was too high for her.  She has been managed medically.  ED Course: Upon arrival to the ED, patient is found to be afebrile and saturating well on room air with normal HR and elevated BP.  Labs are most notable for potassium 5.2, bicarbonate 15, creatinine 5.02, glucose 251, hemoglobin 8.7, and troponin of 17 which increased to 35.  Chest x-ray is negative for acute findings.  Cardiology (Dr. Debarah) was consulted by the ED physician and recommended medical admission.  The patient was given a liter of saline and nitroglycerin  ointment in the ED.  Review of Systems:  All other  systems reviewed and apart from HPI, are negative.  Past Medical History:  Diagnosis Date   Allergic rhinitis    Chronic heart failure with preserved ejection fraction (HFpEF) (HCC) 08/19/2024   Chronic respiratory failure with hypoxia (HCC) 08/19/2024   CKD (chronic kidney disease) stage 5, GFR less than 15 ml/min (HCC) 08/19/2024   COPD (chronic obstructive pulmonary disease) (HCC)    Diabetes mellitus without complication (HCC)    HLD (hyperlipidemia)    Hypertension    Migraine    Peripheral neuropathy     Past Surgical History:  Procedure Laterality Date   NO PAST SURGERIES      Social History:   reports that she has been smoking. She has never used smokeless tobacco. She reports that she does not drink alcohol and does not use drugs.  Allergies  Allergen Reactions   Oxycodone Other (See Comments)    Drowsiness, shaking    Pregabalin Anxiety and Other (See Comments)    Altered mental status Altered mental status Altered mental status Altered mental status tremors     Family History  Problem Relation Age of Onset   Neurofibromatosis Neg Hx      Prior to Admission medications   Medication Sig Start Date End Date Taking? Authorizing Provider  atorvastatin  (LIPITOR) 80 MG tablet Take 80 mg by mouth daily.   Yes [provider]  carvedilol  (COREG ) 25 MG tablet Take 25 mg by mouth 2 (two) times daily with a meal.   Yes [provider]  Fluticasone-Umeclidin-Vilant (TRELEGY ELLIPTA) 100-62.5-25 MCG/ACT AEPB  Inhale 1 puff into the lungs daily at 6 (six) AM.   Yes [provider]  furosemide (LASIX) 40 MG tablet Take 60 mg by mouth 2 (two) times daily.   Yes [provider]  ipratropium-albuterol  (DUONEB) 0.5-2.5 (3) MG/3ML SOLN Take 3 mLs by nebulization every 6 (six) hours as needed (SOB or wheezing).   Yes [provider]  isosorbide mononitrate (IMDUR) 30 MG 24 hr tablet Take 30 mg by mouth daily.   Yes [provider]  sodium bicarbonate 650 MG tablet Take 650 mg by mouth 2 (two) times daily.   Yes [provider]  amLODipine  (NORVASC ) 10 MG tablet Take 1 tablet (10 mg total) by mouth daily. 09/09/21 10/09/21  Rizwan, Saima, MD  aspirin  81 MG EC tablet Take 81 mg by mouth daily.    [provider]  azelastine (ASTELIN) 0.1 % nasal spray Place 2 sprays into both nostrils 2 (two) times daily. 09/30/19   [provider]  benzonatate (TESSALON) 100 MG capsule Take 200 mg by mouth 3 (three) times daily as needed for cough. 06/28/21   [provider]  calcitRIOL (ROCALTROL) 0.25 MCG capsule Take 0.25 mcg by mouth every other day. 06/07/21   [provider]  CETIRIZINE HCL PO Take 10 mg by mouth daily.     [provider]  clopidogrel  (PLAVIX ) 75 MG tablet Take 75 mg by mouth daily. 07/13/21   [provider]  docusate sodium (COLACE) 100 MG capsule Take 100 mg by mouth daily as needed for mild constipation.    [provider]  famotidine (PEPCID) 10 MG tablet Take 10 mg by mouth daily. 09/04/21 09/19/21  [provider]  gabapentin  (NEURONTIN ) 100 MG capsule Take 100 mg by mouth at bedtime. 09/03/21 09/14/21  [provider]  glimepiride (AMARYL) 1 MG tablet Take 1 mg by mouth daily. 07/13/21   [provider]  guaiFENesin (MUCINEX) 600 MG 12 hr tablet Take 600 mg by mouth 2 (two) times daily as needed for cough or to loosen phlegm.    [provider]  LEVEMIR FLEXTOUCH 100 UNIT/ML FlexTouch Pen Inject 18 Units into the skin at bedtime. 04/05/21   [provider]  melatonin 3 MG TABS tablet Take 3 mg by mouth at bedtime as needed (sleep).    [provider]  VENTOLIN  HFA 108 (90 Base) MCG/ACT inhaler Inhale 2 puffs into the lungs every 6 (six) hours as needed for shortness of breath or wheezing. 09/03/21   [provider]    Physical Exam: Vitals:   08/19/24 0115 08/19/24  0130 08/19/24 0145 08/19/24 0227  BP: (!) 172/69 (!) 159/63 (!) 153/66   Pulse: 81 78 78 78  Resp: (!) 24 12 16 20   Temp:    97.6 F (36.4 C)  TempSrc:    Oral  SpO2: 99% 99% 99% 99%  Weight:      Height:        Constitutional: NAD, no pallor or diaphoresis   Eyes: PERTLA, lids and conjunctivae normal ENMT: Mucous membranes are moist. Posterior pharynx clear of any exudate or lesions.   Neck: supple, no masses  Respiratory: no wheezing, no crackles. No accessory muscle use.  Cardiovascular: S1 & S2 heard, regular rate and rhythm. Trace lower extremity edema.  Abdomen: No tenderness, soft. Bowel sounds active.  Musculoskeletal: no clubbing / cyanosis. No joint deformity upper and lower extremities.   Skin: no significant rashes, lesions, ulcers. Warm, dry, well-perfused. Neurologic:  CN 2-12 grossly intact. Moving all extremities. Alert and oriented.  Psychiatric: Calm. Cooperative.    Labs and Imaging on Admission: I have personally reviewed following labs and imaging studies  CBC: Recent Labs  Lab 08/18/24 2221  WBC 5.3  HGB 8.7*  HCT 30.2*  MCV 91.5  PLT 192   Basic Metabolic Panel: Recent Labs  Lab 08/18/24 2221  NA 136  K 5.2*  CL 114*  CO2 15*  GLUCOSE 251*  BUN 52*  CREATININE 5.02*  CALCIUM  8.4*   GFR: Estimated Creatinine Clearance: 9 mL/min (A) (by C-G formula based on SCr of 5.02 mg/dL (H)). Liver Function Tests: No results for input(s): AST, ALT, ALKPHOS, BILITOT, PROT, ALBUMIN  in the last 168 hours. No results for input(s): LIPASE, AMYLASE in the last 168 hours. No results for input(s): AMMONIA in the last 168 hours. Coagulation Profile: No results for input(s): INR, PROTIME in the last 168 hours. Cardiac Enzymes: No results for input(s): CKTOTAL, CKMB, CKMBINDEX, TROPONINI in the last 168 hours. BNP (last 3 results) No results for input(s): PROBNP in the last 8760 hours. HbA1C: No results for input(s): HGBA1C  in the last 72 hours. CBG: No results for input(s): GLUCAP in the last 168 hours. Lipid Profile: No results for input(s): CHOL, HDL, LDLCALC, TRIG, CHOLHDL, LDLDIRECT in the last 72 hours. Thyroid Function Tests: No results for input(s): TSH, T4TOTAL, FREET4, T3FREE, THYROIDAB in the last 72 hours. Anemia Panel: No results for input(s): VITAMINB12, FOLATE, FERRITIN, TIBC, IRON, RETICCTPCT in the last 72 hours. Urine analysis: No results found for: COLORURINE, APPEARANCEUR, LABSPEC, PHURINE, GLUCOSEU, HGBUR, BILIRUBINUR, KETONESUR, PROTEINUR, UROBILINOGEN, NITRITE, LEUKOCYTESUR Sepsis Labs: @LABRCNTIP (procalcitonin:4,lacticidven:4) )No results found for this or any previous visit (from the past 240 hours).   Radiological Exams on Admission: DG Chest Portable 1 View Result Date: 08/18/2024 EXAM: 1 VIEW(S) XRAY OF THE CHEST 08/18/2024 10:38:00 PM COMPARISON: 04/15/2024 CLINICAL HISTORY: Chest Pain FINDINGS: LUNGS AND PLEURA: Glenoid airspace opacity in the left mid lung likely represents atelectasis or scarring. No pleural effusion. No pneumothorax. HEART AND MEDIASTINUM: Aortic calcification. No acute abnormality of the cardiac silhouette. BONES AND SOFT TISSUES: No acute osseous abnormality. IMPRESSION: 1. No acute cardiopulmonary findings. Electronically signed by: Norman Gatlin MD 08/18/2024 10:56 PM EST RP Workstation: [id number]    EKG: Independently reviewed. Sinus rhythm, repolarization abnormality, similar to prior.   Assessment/Plan   1. NSTEMI  - Pt with known multivessel CAD presents with chest pain that improved with nitroglycerin    - Troponin was 17 then 35 two hours later  - She was given ASA 324 by EMS  - Continue cardiac monitoring, trend troponin, repeat EKG, start IV heparin , continue daily ASA 81, Plavix , and Lipitor    2. CKD V; metabolic acidosis  - SCr is 5.02 on admission, lower than most recent prior  values  - Renally-dose medications, continue bicarbonate    3. Chronic HFpEF  - EF was normal with moderate LVH, grade 2 diastolic dysfunction, and moderate mitral stenosis on echo from July 2025   - Appears compensated  - Continue Lasix     4. COPD; chronic hypoxic respiratory failure  - Not in exacerbation  - Continue ICS-LAMA-LABA and as-needed DuoNebs    5. Type II DM  - A1c was 7.0% in July 2025  - Check CBGs and use low-intensity SSI for now    6. Anemia  - Appears stable, no overt bleeding  - Monitor closely while starting IV heparin      DVT prophylaxis: IV  heparin   Code Status: Full  Level of Care: Level of care: Progressive Family Communication: Son at bedside  Disposition Plan:  Patient is from: Home  Anticipated d/c is to: TBD Anticipated d/c date is: 08/21/24  Patient currently: Pending cardiology consultation, disposition planning Consults called: Cardiology  Admission status: Inpatient     Sabrina GORMAN Sprinkles, MD [facility]  08/19/2024, 2:38 AM

## 2024-08-19 NOTE — Consult Note (Addendum)
 Patient ID: Sabrina Hanson MRN: [id number]; DOB: 08-May-1946   Admission date: 08/18/2024  Primary Care Provider: [name], [name], MD Primary Cardiologist:[facility]   Chief Complaint:  Chest pain  Patient Profile:   Sabrina Hanson is a 78 y.o. female with history of NSTEMI, multivessel CAD under medical management, HFpEF, CKD V, COPD, TIIDM, HTN, HLD, who was admitted on 12/1 with chest pain and concern for NSTEMI.  History of Present Illness:   Sabrina Hanson reports that she has had intermittent chest pain that has been present for several months now. Her chest pain happens 2x per day and lasts about 30 minutes. She reports that her pain radiates to her left arm and also into her jaw. If feels like a pressure and eventually goes away on its own. She has shortness of breath with mild exertion. She reports some swelling in her ankles. Yesterday, she had chest pain that became unrelenting and she called EMS. She received 324mg  aspirin  and nitroglycerin x 2 prior to arrival in the ED. Troponins uptrending although mild. Lab findings consistent with chronic kidney disease. EKG with no ischemic changes.  She reports that she has not taken any medications in months due to perceived non-benefit to her, but is willing to make changes to preserve her kidney function.  She was recently admitted to [facility] in July of 2025 with an NSTEMI and transferred to [facility] for CABG evaluation. At that time, she declined surgical management and PCI due to the risk of ESRD. She was medically optimized with aspirin , atorvastatin 80mg , plavix  75mg , Carvedilol 25mg  daily, lasix 60mg  BID, however, she reports that she has not been taking any medication.   Left heart cath at [facility] showed left main 70% stenosis, mid LAD 50%, diagonal 90% ostial, ostial left circumflex 70%, mid left circumflex 75% and occluded RCA.   Past Medical History:  Diagnosis Date   Allergic rhinitis    Chronic heart failure with preserved  ejection fraction (HFpEF) (HCC) 08/19/2024   Chronic respiratory failure with hypoxia (HCC) 08/19/2024   CKD (chronic kidney disease) stage 5, GFR less than 15 ml/min (HCC) 08/19/2024   COPD (chronic obstructive pulmonary disease) (HCC)    Diabetes mellitus without complication (HCC)    HLD (hyperlipidemia)    Hypertension    Migraine    Peripheral neuropathy     Past Surgical History:  Procedure Laterality Date   NO PAST SURGERIES       Medications Prior to Admission: Prior to Admission medications   Medication Sig Start Date End Date Taking? Authorizing Provider  docusate sodium (COLACE) 100 MG capsule Take 100 mg by mouth at bedtime.   Yes [provider]  fluticasone (FLONASE) 50 MCG/ACT nasal spray Place 1 spray into both nostrils daily as needed for allergies or rhinitis. 03/21/24  Yes [provider]  Fluticasone-Umeclidin-Vilant (TRELEGY ELLIPTA) 100-62.5-25 MCG/ACT AEPB Inhale 1 puff into the lungs daily at 6 (six) AM.   Yes [provider]  rosuvastatin  (CRESTOR ) 20 MG tablet Take 20 mg by mouth at bedtime.   Yes [provider]  aspirin  81 MG EC tablet Take 81 mg by mouth daily. Patient not taking: Reported on 08/19/2024    [provider]  calcitRIOL (ROCALTROL) 0.25 MCG capsule Take 0.25 mcg by mouth every other day. Patient not taking: Reported on 08/19/2024 06/07/21   [provider]  carvedilol (COREG) 6.25 MG tablet Take 6.25 mg by mouth 2 (two) times daily. Patient not taking: Reported on 08/19/2024  01/01/24 12/31/24  [provider]  cetirizine (ZYRTEC) 10 MG tablet Take 10 mg by mouth daily.  Patient not taking: Reported on 08/19/2024    [provider]  Cholecalciferol (VITAMIN D-1000 MAX ST) 25 MCG (1000 UT) tablet Take 1,000 Units by mouth daily. Patient not taking: Reported on 08/19/2024    [provider]  clopidogrel  (PLAVIX ) 75 MG tablet Take 75 mg by mouth daily. Patient not taking:  Reported on 08/19/2024 07/13/21   [provider]  folic acid (FOLVITE) 1 MG tablet Take 1 mg by mouth daily. Patient not taking: Reported on 08/19/2024 09/27/23   [provider]  furosemide (LASIX) 40 MG tablet Take 60 mg by mouth 2 (two) times daily. Patient not taking: Reported on 08/19/2024    [provider]  nitroGLYCERIN (NITROSTAT) 0.4 MG SL tablet Place 0.4 mg under the tongue every 5 (five) minutes as needed for chest pain. Patient not taking: Reported on 08/19/2024 04/22/24 04/22/25  [provider]   Has not taken any medications.  Allergies:    Allergies  Allergen Reactions   Other Other (See Comments)    PATIENT IS EXTREMELY SENSITIVE TO ALL PAIN MEDICATIONS PER FAMILY MEMBERS WHO MANAGE MEDS AT HOME.    Oxycodone Other (See Comments)    Drowsiness, shaking    Pregabalin Anxiety and Other (See Comments)    Altered mental status Altered mental status Altered mental status Altered mental status tremors     Social History:   Social History   Socioeconomic History   Marital status: Single    Spouse name: Not on file   Number of children: 1   Years of education: 12   Highest education level: Not on file  Occupational History   Occupation: Retired  Tobacco Use   Smoking status: Every Day   Smokeless tobacco: Never  Substance and Sexual Activity   Alcohol use: No    Alcohol/week: 0.0 standard drinks of alcohol   Drug use: No   Sexual activity: Not on file  Other Topics Concern   Not on file  Social History Narrative   Lives alone   Caffeine use: Daily    Social Drivers of Health   Financial Resource Strain: Low Risk  (05/28/2024)   Received from [facility]   Overall Financial Resource Strain (CARDIA)    Difficulty of Paying Living Expenses: Not very hard  Food Insecurity: No Food Insecurity (08/19/2024)   Hunger Vital Sign    Worried About Running Out of Food in the Last Year: Never true    Ran Out of Food in  the Last Year: Never true  Transportation Needs: No Transportation Needs (08/19/2024)   PRAPARE - Administrator, Civil Service (Medical): No    Lack of Transportation (Non-Medical): No  Physical Activity: Insufficiently Active (11/15/2023)   Received from [facility]   Exercise Vital Sign    On average, how many days per week do you engage in moderate to strenuous exercise (like a brisk walk)?: 7 days    On average, how many minutes do you engage in exercise at this level?: 10 min  Stress: No Stress Concern Present (11/15/2023)   Received from [facility] - Occupational Stress Questionnaire    Feeling of Stress : Not at all  Social Connections: Unknown (08/19/2024)   Social Connection and Isolation Panel    Frequency of Communication with Friends and Family: More than  three times a week    Frequency of Social Gatherings with Friends and Family: More than three times a week    Attends Religious Services: Not on file    Active Member of Clubs or Organizations: Not on file    Attends Banker Meetings: Not on file    Marital Status: Not on file  Intimate Partner Violence: Not At Risk (08/19/2024)   Humiliation, Afraid, Rape, and Kick questionnaire    Fear of Current or Ex-Partner: No    Emotionally Abused: No    Physically Abused: No    Sexually Abused: No    Family History:   The patient's family history is negative for Neurofibromatosis.    ROS:  Please see the history of present illness.     Physical Exam/Data:   Vitals:   08/19/24 0410 08/19/24 0440 08/19/24 0447 08/19/24 0759  BP: (!) 166/75  (!) 155/70 138/70  Pulse: 75 93 84 81  Resp:    19  Temp:    98.5 F (36.9 C)  TempSrc:    Oral  SpO2: 96% 97% 96% 100%  Weight:  70.7 kg    Height:        Intake/Output Summary (Last 24 hours) at 08/19/2024 0851 Last data filed at 08/19/2024 0559 Gross per 24 hour  Intake 47.85 ml  Output --  Net 47.85  ml      08/19/2024    4:40 AM 08/18/2024   10:21 PM 09/08/2021    4:38 AM  Last 3 Weights  Weight (lbs) 155 lb 13.8 oz 162 lb 170 lb 6.7 oz  Weight (kg) 70.7 kg 73.483 kg 77.3 kg     Body mass index is 28.51 kg/m.     EKG:  The ECG that was done 12/1 was personally reviewed and demonstrates NSR with no ischemic changes  Relevant CV Studies: Echo from 7/31 with EF >55 with Grade 2 diastolic dysfunction, moderate mitral stenosis  RHC from 8/1 showed normal RHC filling pressures   Left heart cath at [facility] showed left main 70% stenosis, mid LAD 50%, diagonal 90% ostial, ostial left circumflex 70%, mid left circumflex 75% and occluded RCA.   Laboratory Data:  Chemistry Recent Labs  Lab 08/18/24 2221  NA 136  K 5.2*  CL 114*  CO2 15*  GLUCOSE 251*  BUN 52*  CREATININE 5.02*  CALCIUM  8.4*  GFRNONAA 8*  ANIONGAP 7   Hematology Recent Labs  Lab 08/18/24 2221 08/19/24 0637  WBC 5.3 5.4  RBC 3.30* 2.97*  HGB 8.7* 7.8*  HCT 30.2* 26.3*  MCV 91.5 88.6  MCH 26.4 26.3  MCHC 28.8* 29.7*  RDW 15.9* 15.8*  PLT 192 195   Cardiac Enzymes  Latest Reference Range & Units 08/18/24 22:21 08/19/24 00:21 08/19/24 02:44 08/19/24 06:37  Troponin I (High Sensitivity) <18 ng/L 17 35 (H) 69 (H) 97 (H)  (H): Data is abnormally high  Radiology/Studies:  DG Chest Portable 1 View Result Date: 08/18/2024 EXAM: 1 VIEW(S) XRAY OF THE CHEST 08/18/2024 10:38:00 PM COMPARISON: 04/15/2024 CLINICAL HISTORY: Chest Pain FINDINGS: LUNGS AND PLEURA: Glenoid airspace opacity in the left mid lung likely represents atelectasis or scarring. No pleural effusion. No pneumothorax. HEART AND MEDIASTINUM: Aortic calcification. No acute abnormality of the cardiac silhouette. BONES AND SOFT TISSUES: No acute osseous abnormality. IMPRESSION: 1. No acute cardiopulmonary findings. Electronically signed by: Norman Gatlin MD 08/18/2024 10:56 PM EST RP Workstation: [id number]    Assessment and Plan:    NSTEMI Medical non-compliance  Troponins rising in the setting of highly suspicious history and know CAD. TIMI 5. She has not taken any medications other than rescue albuterol  since she was last seen at the cardiology office in September. She expressed a desire to proceed with stenting at this time, however she is a poor candidate given her documented non-compliance. At this time, she is chest pain free and there are no new changes on the EKG. Will medically optimize the patient until such time that she may demonstrate closer compliance with medical management and candidacy for stent. Her BP likely contributed to her elevated troponin and she is unable to clear it effectively due to her ESRD. -D/C heparin  -Continue aspirin  81mg  daily -Continue Plavix  75mg  daily -Continue Atorvastatin 80mg  daily- -Refill medications as needed, patient states she is in need of refills. -F/U with outpatient cardiologist  HFpEF Last Echo 7/31 with EF>55% and minimal valvular disease. No signs of exacerbation at this point and she is on her home O2 of 2L. Outpatient regimen of Lasix 60mg  BID and Coreg 25mg  BID. Afterload reduction with Imdur, which was recently added at an outpatient cardiology visit. -Continue Coreg 25mg  BID -Continue Lasix 60mg  BID  HTN BP 185/65 on admission. Improved to 138/70 with Imdur and Coreg -Continue Coreg 25mg  BID -Continue Imdur 30mg  daily -Monitor BP  Per primary team CKD V Metabolic acidosis COPD Type II DM Anemia   For questions or updates, please contact [facility] Please consult [email or web address] for contact info under       Signed, Melvenia Morrison, MD , PGY-1 Internal medicine Cardiology service 08/19/2024 8:51 AM     ATTENDING ATTESTATION:  After conducting a review of all available clinical information with the care team, interviewing the patient, and performing a physical exam, I agree with the findings and plan described in this note.   GEN:  No acute distress, AO x 3 HEENT:  MMM, no JVD, no scleral icterus Cardiac: RRR, no murmurs, rubs, or gallops.  Respiratory: Clear to auscultation bilaterally. GI: Soft, nontender, non-distended  MS: No edema; No deformity. Neuro:  Nonfocal  Vasc:  +2 radial pulses  Patient is a 78 year old female with a history of known multivessel disease consisting of 70% left main, 50% mid LAD, 90% ostial diagonal, 70% ostial left circumflex, 75% mid left circumflex, and occluded RCA on coronary angiography performed at [facility] in August 2025.  At that point in time both CABG and PCI was deferred given the patient's risk of developing end-stage renal disease.  The patient also has a history of CKD stage V, COPD, type 2 diabetes, hypertension, and hyperlipidemia.  She was last seen by her primary cardiologist at [facility] in September.  At that point in time her chest pain was fairly well-controlled however given infrequent episodes of exertional angina she was started on Imdur 30 mg.  She has not taken any of her medications within the last few months.  She presents with hypertensive urgency.  Her troponins were only mildly elevated and her EKG demonstrated no acute ischemic changes.  She is chest pain-free now while on medical therapy.  Will discontinue heparin  infusion.  I believe the patient should be treated with medical therapy and no coronary angiography should be pursued.  She needs to demonstrate compliance with medical therapy.  If medical therapy fails then CABG versus PCI can then be pursued.  Additionally if PCI were to be pursued in the future documented compliance with medical therapy will be important in regards to dual  antiplatelet therapy thereafter.  Discussed with patient and son on phone.  Lurena Red, MD Pager [phone]

## 2024-08-19 NOTE — Care Plan (Signed)
 This 78 years old female with PMH significant for hypertension, hyperlipidemia, type 2 diabetes, COPD, chronic hypoxic respiratory failure, chronic HFp EF CKD stage V, multivessel CAD managed medically presented in the ED with complaints of chest pain. Patient report has been experiencing chest discomfort for months but symptoms are more intense last night prompting her to come to ED.  Patient had been admitted to [facility] in [place] in late July with NSTEMI, she was found to have multivessel coronary artery disease and she was transferred to [facility] for CABG evaluation at that time.  At [facility] she refused to have cardiac surgery or PCI despite multiple discussions, feeling that the risk of progression to ESRD was too high for her.  She has been managed medically. She continued to have elevated troponin trending up.  Cardiology is consulted.  Patient started on heparin  drip.  Cardiology has evaluated the patient.  Patient remains chest pain-free.  Heparin  drip was discontinued.  She does not require coronary angiography.  She needs to demonstrate compliance with medical therapy.  Patient was seen and examined at bedside.

## 2024-08-19 NOTE — Progress Notes (Signed)
 PHARMACY - ANTICOAGULATION CONSULT NOTE  Pharmacy Consult for Heparin  Indication: chest pain/ACS  Allergies  Allergen Reactions   Oxycodone Other (See Comments)    Drowsiness, shaking    Pregabalin Anxiety and Other (See Comments)    Altered mental status Altered mental status Altered mental status Altered mental status tremors     Patient Measurements: Height: 5' 2 (157.5 cm) Weight: 73.5 kg (162 lb) IBW/kg (Calculated) : 50.1 HEPARIN  DW (KG): 65.9  Vital Signs: Temp: 97.6 F (36.4 C) (12/01 0227) Temp Source: Oral (12/01 0227) BP: 153/66 (12/01 0145) Pulse Rate: 78 (12/01 0227)  Labs: Recent Labs    08/18/24 2221 08/19/24 0021  HGB 8.7*  --   HCT 30.2*  --   PLT 192  --   CREATININE 5.02*  --   TROPONINIHS 17 35*    Estimated Creatinine Clearance: 9 mL/min (A) (by C-G formula based on SCr of 5.02 mg/dL (H)).   Medical History: Past Medical History:  Diagnosis Date   Allergic rhinitis    Chronic heart failure with preserved ejection fraction (HFpEF) (HCC) 08/19/2024   Chronic respiratory failure with hypoxia (HCC) 08/19/2024   CKD (chronic kidney disease) stage 5, GFR less than 15 ml/min (HCC) 08/19/2024   COPD (chronic obstructive pulmonary disease) (HCC)    Diabetes mellitus without complication (HCC)    HLD (hyperlipidemia)    Hypertension    Migraine    Peripheral neuropathy     Medications:  No current facility-administered medications on file prior to encounter.   Current Outpatient Medications on File Prior to Encounter  Medication Sig Dispense Refill   atorvastatin (LIPITOR) 80 MG tablet Take 80 mg by mouth daily.     carvedilol (COREG) 25 MG tablet Take 25 mg by mouth 2 (two) times daily with a meal.     Fluticasone-Umeclidin-Vilant (TRELEGY ELLIPTA) 100-62.5-25 MCG/ACT AEPB Inhale 1 puff into the lungs daily at 6 (six) AM.     furosemide (LASIX) 40 MG tablet Take 60 mg by mouth 2 (two) times daily.     ipratropium-albuterol  (DUONEB)  0.5-2.5 (3) MG/3ML SOLN Take 3 mLs by nebulization every 6 (six) hours as needed (SOB or wheezing).     isosorbide mononitrate (IMDUR) 30 MG 24 hr tablet Take 30 mg by mouth daily.     sodium bicarbonate 650 MG tablet Take 650 mg by mouth 2 (two) times daily.     [DISCONTINUED] Fluticasone-Umeclidin-Vilant (TRELEGY ELLIPTA) 100-62.5-25 MCG/ACT AEPB Inhale 1 puff into the lungs.     amLODipine  (NORVASC ) 10 MG tablet Take 1 tablet (10 mg total) by mouth daily. 30 tablet 0   aspirin  81 MG EC tablet Take 81 mg by mouth daily.     azelastine (ASTELIN) 0.1 % nasal spray Place 2 sprays into both nostrils 2 (two) times daily.     benzonatate (TESSALON) 100 MG capsule Take 200 mg by mouth 3 (three) times daily as needed for cough.     calcitRIOL (ROCALTROL) 0.25 MCG capsule Take 0.25 mcg by mouth every other day.     CETIRIZINE HCL PO Take 10 mg by mouth daily.      clopidogrel  (PLAVIX ) 75 MG tablet Take 75 mg by mouth daily.     docusate sodium (COLACE) 100 MG capsule Take 100 mg by mouth daily as needed for mild constipation.     famotidine (PEPCID) 10 MG tablet Take 10 mg by mouth daily.     gabapentin  (NEURONTIN ) 100 MG capsule Take 100 mg by mouth at bedtime.  glimepiride (AMARYL) 1 MG tablet Take 1 mg by mouth daily.     guaiFENesin (MUCINEX) 600 MG 12 hr tablet Take 600 mg by mouth 2 (two) times daily as needed for cough or to loosen phlegm.     LEVEMIR FLEXTOUCH 100 UNIT/ML FlexTouch Pen Inject 18 Units into the skin at bedtime.     melatonin 3 MG TABS tablet Take 3 mg by mouth at bedtime as needed (sleep).     VENTOLIN  HFA 108 (90 Base) MCG/ACT inhaler Inhale 2 puffs into the lungs every 6 (six) hours as needed for shortness of breath or wheezing.     [DISCONTINUED] rosuvastatin  (CRESTOR ) 10 MG tablet Take 10 mg by mouth at bedtime.       Assessment: 78 y.o. female with chest pain for heparin   Goal of Therapy:  Heparin  level 0.3-0.7 units/ml Monitor platelets by anticoagulation protocol:  Yes   Plan:  Heparin  4000 units IV bolus, then start heparin  850 units/hr Check heparin  level in 8 hours.   Juquan Reznick, Cordella Misty 08/19/2024,2:40 AM

## 2024-08-19 NOTE — Progress Notes (Signed)
 Patient reported chest pain 6/10 this am.  1 SL NTG given with relief, O2 at 2L per [place] applied, IV heparin  bolus and drip after discussing again and consent received to proceed.  EKG done and MD notified.  Pharmacy also notified of Heparin  initiation.

## 2024-08-19 NOTE — TOC Initial Note (Signed)
 Transition of Care [facility]) - Initial/Assessment Note    Patient Details  Name: Sabrina Hanson MRN: [id number] Date of Birth: 10-05-1945  Transition of Care [facility]) CM/SW Contact:    Sudie Erminio Deems, RN Phone Number: 08/19/2024, 3:50 PM  Clinical Narrative: Patient presented for chest pain. Patient states she currently lives with her son and his girlfriend. Patient states her home is in [place]. Patient has DME cane, rolling walker, wheelchair, and oxygen 2 liters via [employer] in [place]. Patient has used home health services in the past; however, is not currently active with services. ICM will continue to follow for for additional needs as the patient progresses.                   Expected Discharge Plan: Home/Self Care Barriers to Discharge: Continued Medical Work up   Patient Goals and CMS Choice Patient states their goals for this hospitalization and ongoing recovery are:: Plans to return home with son and his girlfriend.          Expected Discharge Plan and Services In-house Referral: NA Discharge Planning Services: CM Consult Post Acute Care Choice: NA Living arrangements for the past 2 months: Apartment                   DME Agency: NA                  Prior Living Arrangements/Services Living arrangements for the past 2 months: Apartment Lives with:: Relatives Patient language and need for interpreter reviewed:: Yes Do you feel safe going back to the place where you live?: Yes      Need for Family Participation in Patient Care: Yes (Comment) Care giver support system in place?: Yes (comment) Current home services: DME (cane, wheelchair, rolling walker, oxygen 2 liters via [employer] in [place] Criminal Activity/Legal Involvement Pertinent to Current Situation/Hospitalization: No - Comment as needed  Activities of Daily Living   ADL Screening (condition at time of admission) Independently performs ADLs?: Yes (appropriate for  developmental age) Is the patient deaf or have difficulty hearing?: No Does the patient have difficulty seeing, even when wearing glasses/contacts?: No Does the patient have difficulty concentrating, remembering, or making decisions?: No  Permission Sought/Granted Permission sought to share information with : Family Supports, Case Manager                Emotional Assessment Appearance:: Appears stated age Attitude/Demeanor/Rapport: Engaged Affect (typically observed): Appropriate Orientation: : Oriented to Self, Oriented to Place, Oriented to Situation, Oriented to  Time Alcohol / Substance Use: Not Applicable Psych Involvement: No (comment)  Admission diagnosis:  Unstable angina (HCC) [I20.0] NSTEMI (non-ST elevated myocardial infarction) [facility]) [I21.4] Patient Active Problem List   Diagnosis Date Noted   NSTEMI (non-ST elevated myocardial infarction) (HCC) 08/19/2024   CKD (chronic kidney disease) stage 5, GFR less than 15 ml/min (HCC) 08/19/2024   Chronic heart failure with preserved ejection fraction (HFpEF) (HCC) 08/19/2024   Normocytic anemia 08/19/2024   Metabolic acidosis 08/19/2024   Chronic respiratory failure with hypoxia (HCC) 08/19/2024   Bilateral pneumonia 09/06/2021   SOB (shortness of breath) 09/06/2021   COPD (chronic obstructive pulmonary disease) (HCC) 09/06/2021   Allergic rhinitis    Hypertension    HLD (hyperlipidemia)    Foot drop, right 11/01/2015   Diabetes mellitus without complication (HCC) 11/01/2015   Neuropathy 11/01/2015   PCP:  [name], [name], MD Pharmacy:   [facility] [place], [place] - [address] [place] [address] Phone: [phone] Fax: [phone]  [id number] [place], [place] - [address] AT [facility] [address] [place] [address] Phone: [phone] Fax: [phone]  [id number] [place], [place] - [address] [place] [address] Phone: [phone] Fax: [phone]     Social Drivers of Health (SDOH) Social History: SDOH Screenings   Food Insecurity: No Food Insecurity (08/19/2024)  Housing: Low Risk  (08/19/2024)  Transportation Needs: No Transportation Needs (08/19/2024)  Utilities: Not At Risk (08/19/2024)  Financial Resource Strain: Low Risk  (05/28/2024)   Received from [facility]  Physical Activity: Insufficiently Active (11/15/2023)   Received from [facility]  Social Connections: Unknown (08/19/2024)  Stress: No Stress Concern Present (11/15/2023)   Received from [facility]  Tobacco Use: High Risk (08/19/2024)  Health Literacy: Low Risk (11/15/2023)   Received from [facility]   SDOH Interventions:     Readmission Risk Interventions     No data to display

## 2024-08-19 NOTE — Plan of Care (Signed)
  Problem: Education: Goal: Ability to describe self-care measures that may prevent or decrease complications (Diabetes Survival Skills Education) will improve Outcome: Progressing   Problem: Coping: Goal: Ability to adjust to condition or change in health will improve Outcome: Progressing   Problem: Metabolic: Goal: Ability to maintain appropriate glucose levels will improve Outcome: Progressing   Problem: Health Behavior/Discharge Planning: Goal: Ability to safely manage health-related needs after discharge will improve Outcome: Progressing   Problem: Clinical Measurements: Goal: Ability to maintain clinical measurements within normal limits will improve Outcome: Progressing   Problem: Clinical Measurements: Goal: Will remain free from infection Outcome: Progressing   Problem: Activity: Goal: Risk for activity intolerance will decrease Outcome: Progressing   Problem: Safety: Goal: Ability to remain free from injury will improve Outcome: Progressing   Problem: Skin Integrity: Goal: Risk for impaired skin integrity will decrease Outcome: Progressing

## 2024-08-19 NOTE — Progress Notes (Signed)
 Patient declining IV heparin  due to bleeding problems at previous hospital.  Trop 69, no chest pain.  Dr. Charlton notified, notifying pharmacy also of heparin  declination.

## 2024-08-20 ENCOUNTER — Other Ambulatory Visit (HOSPITAL_COMMUNITY): Payer: Self-pay

## 2024-08-20 DIAGNOSIS — I5030 Unspecified diastolic (congestive) heart failure: Secondary | ICD-10-CM | POA: Diagnosis not present

## 2024-08-20 DIAGNOSIS — I10 Essential (primary) hypertension: Secondary | ICD-10-CM | POA: Diagnosis not present

## 2024-08-20 DIAGNOSIS — I214 Non-ST elevation (NSTEMI) myocardial infarction: Secondary | ICD-10-CM | POA: Diagnosis not present

## 2024-08-20 LAB — CBC
HCT: 26.4 % — ABNORMAL LOW (ref 36.0–46.0)
Hemoglobin: 7.7 g/dL — ABNORMAL LOW (ref 12.0–15.0)
MCH: 26.1 pg (ref 26.0–34.0)
MCHC: 29.2 g/dL — ABNORMAL LOW (ref 30.0–36.0)
MCV: 89.5 fL (ref 80.0–100.0)
Platelets: 174 K/uL (ref 150–400)
RBC: 2.95 MIL/uL — ABNORMAL LOW (ref 3.87–5.11)
RDW: 15.9 % — ABNORMAL HIGH (ref 11.5–15.5)
WBC: 4.4 K/uL (ref 4.0–10.5)
nRBC: 0 % (ref 0.0–0.2)

## 2024-08-20 LAB — BASIC METABOLIC PANEL WITH GFR
Anion gap: 8 (ref 5–15)
BUN: 57 mg/dL — ABNORMAL HIGH (ref 8–23)
CO2: 17 mmol/L — ABNORMAL LOW (ref 22–32)
Calcium: 8.3 mg/dL — ABNORMAL LOW (ref 8.9–10.3)
Chloride: 113 mmol/L — ABNORMAL HIGH (ref 98–111)
Creatinine, Ser: 5.5 mg/dL — ABNORMAL HIGH (ref 0.44–1.00)
GFR, Estimated: 8 mL/min — ABNORMAL LOW (ref >60)
Glucose, Bld: 82 mg/dL (ref 70–99)
Potassium: 5.4 mmol/L — ABNORMAL HIGH (ref 3.5–5.1)
Sodium: 138 mmol/L (ref 135–145)

## 2024-08-20 LAB — GLUCOSE, CAPILLARY
Glucose-Capillary: 84 mg/dL (ref 70–99)
Glucose-Capillary: 286 mg/dL — ABNORMAL HIGH (ref 70–99)

## 2024-08-20 LAB — POTASSIUM: Potassium: 5.1 mmol/L (ref 3.5–5.1)

## 2024-08-20 LAB — LIPOPROTEIN A (LPA): Lipoprotein (a): 95.5 nmol/L — ABNORMAL HIGH (ref <75.0)

## 2024-08-20 MED ORDER — FUROSEMIDE 20 MG PO TABS
60.0000 mg | ORAL_TABLET | Freq: Two times a day (BID) | ORAL | 0 refills | Status: DC
  Filled 2024-08-20: qty 180, 30d supply, fill #0

## 2024-08-20 MED ORDER — CLOPIDOGREL BISULFATE 75 MG PO TABS
75.0000 mg | ORAL_TABLET | Freq: Every day | ORAL | 0 refills | Status: AC
  Filled 2024-08-20: qty 90, 90d supply, fill #0

## 2024-08-20 MED ORDER — SODIUM ZIRCONIUM CYCLOSILICATE 10 G PO PACK
10.0000 g | PACK | Freq: Once | ORAL | Status: AC
  Administered 2024-08-20: 10 g via ORAL
  Filled 2024-08-20: qty 1

## 2024-08-20 MED ORDER — ATORVASTATIN CALCIUM 80 MG PO TABS
80.0000 mg | ORAL_TABLET | Freq: Every day | ORAL | 0 refills | Status: AC
  Filled 2024-08-20: qty 90, 90d supply, fill #0

## 2024-08-20 MED ORDER — CARVEDILOL 25 MG PO TABS
25.0000 mg | ORAL_TABLET | Freq: Two times a day (BID) | ORAL | 0 refills | Status: AC
  Filled 2024-08-20: qty 180, 90d supply, fill #0

## 2024-08-20 MED ORDER — NITROGLYCERIN 0.4 MG SL SUBL
0.4000 mg | SUBLINGUAL_TABLET | SUBLINGUAL | 3 refills | Status: DC | PRN
  Filled 2024-08-20: qty 25, 8d supply, fill #0

## 2024-08-20 MED ORDER — ASPIRIN 81 MG PO TBEC: 81.0000 mg | DELAYED_RELEASE_TABLET | Freq: Every day | ORAL | Status: AC

## 2024-08-20 MED ORDER — ISOSORBIDE MONONITRATE ER 30 MG PO TB24
30.0000 mg | ORAL_TABLET | Freq: Every day | ORAL | 0 refills | Status: DC
  Filled 2024-08-20: qty 90, 90d supply, fill #0

## 2024-08-20 NOTE — Discharge Summary (Addendum)
 Physician Discharge Summary  Sabrina Hanson FMW:[id number] DOB: 08/01/1946 DOA: 08/18/2024  PCP: Sabrina Hanson GRADE, MD  Admit date: 08/18/2024  Discharge date: 08/20/2024  Admitted From: Home  Disposition:  Home  Recommendations for Outpatient Follow-up:  Follow up with PCP in 1-2 weeks. Please obtain BMP/CBC in one week. Advised to follow up Cardiology in [facility] in one week. Advised to follow up Nephrology as scheduled. Advised medical management.   Home Health: None Equipment/Devices:Home Oxygen @ 2l  Discharge Condition: Stable CODE STATUS:Full code Diet recommendation: Heart Healthy   Brief Summary / Hospital Course: This 78 years old female with PMH significant for hypertension, hyperlipidemia, type 2 diabetes, COPD, chronic hypoxic respiratory failure, chronic HFp EF CKD stage V, multivessel CAD managed medically presented in the ED with complaints of chest pain. Patient reports she has been experiencing chest discomfort for months but symptoms are more intense last night prompting her to come to ED.  Patient had been admitted to [facility] in [place] in late July with NSTEMI, She was found to have multivessel coronary artery disease and she was transferred to [facility] for CABG evaluation at that time.  At [facility] she refused to have cardiac surgery or PCI despite multiple discussions, feeling that the risk of progression to ESRD was too high for her.  She has been managed medically. She continued to have elevated troponin trending up.  Cardiology is consulted.  Patient started on heparin  drip.  Cardiology has evaluated the patient.  Patient remains chest pain-free.  Heparin  drip was discontinued.  She does not require coronary angiography at this time.  She needs to demonstrate compliance with medical therapy.   Patient remained chest pain-free.  Heart rate and blood pressure are both well-controlled.  Patient ambulating without angina.  Cardiology signed off,  states patient can be discharged with close follow-up in [place] clinic in 1 week.  Patient feels much better and wants to be discharged home. Chest pain has resolved.  Discharge Diagnoses:  Principal Problem:   NSTEMI (non-ST elevated myocardial infarction) (HCC) Active Problems:   Diabetes mellitus without complication (HCC)   COPD (chronic obstructive pulmonary disease) (HCC)   CKD (chronic kidney disease) stage 5, GFR less than 15 ml/min (HCC)   Chronic heart failure with preserved ejection fraction (HFpEF) (HCC)   Normocytic anemia   Metabolic acidosis   Chronic respiratory failure with hypoxia [facility])    Discharge Instructions:  Discharge Instructions     Call MD for:  difficulty breathing, headache or visual disturbances   Complete by: As directed    Call MD for:  persistant dizziness or light-headedness   Complete by: As directed    Call MD for:  persistant nausea and vomiting   Complete by: As directed    Diet - low sodium heart healthy   Complete by: As directed    Diet Carb Modified   Complete by: As directed    Discharge instructions   Complete by: As directed    Advised to follow up PCP in one week. Advised to follow up Cardiology in [facility] in one week. Advised to follow up Nephrology as scheduled. Advised medical management.   Increase activity slowly   Complete by: As directed       Allergies as of 08/20/2024       Reactions   Other Other (See Comments)   PATIENT IS EXTREMELY SENSITIVE TO ALL PAIN MEDICATIONS PER FAMILY MEMBERS WHO MANAGE MEDS AT HOME.    Oxycodone Other (  See Comments)   Drowsiness, shaking    Pregabalin Anxiety, Other (See Comments)   Altered mental status Altered mental status Altered mental status Altered mental status tremors        Medication List     STOP taking these medications    calcitRIOL 0.25 MCG capsule Commonly known as: ROCALTROL   CETIRIZINE HCL PO   folic acid 1 MG tablet Commonly known as:  FOLVITE   rosuvastatin  20 MG tablet Commonly known as: CRESTOR    Vitamin D-1000 Max St 25 MCG (1000 UT) tablet Generic drug: Cholecalciferol       TAKE these medications    aspirin  EC 81 MG tablet Take 1 tablet (81 mg total) by mouth daily.   atorvastatin 80 MG tablet Commonly known as: LIPITOR Take 1 tablet (80 mg total) by mouth daily.   carvedilol 25 MG tablet Commonly known as: COREG Take 1 tablet (25 mg total) by mouth 2 (two) times daily with a meal.   clopidogrel  75 MG tablet Commonly known as: PLAVIX  Take 1 tablet (75 mg total) by mouth daily.   docusate sodium 100 MG capsule Commonly known as: COLACE Take 100 mg by mouth at bedtime.   fluticasone 50 MCG/ACT nasal spray Commonly known as: FLONASE Place 1 spray into both nostrils daily as needed for allergies or rhinitis.   furosemide 20 MG tablet Commonly known as: LASIX Take 3 tablets (60 mg total) by mouth 2 (two) times daily.   isosorbide mononitrate 30 MG 24 hr tablet Commonly known as: IMDUR Take 1 tablet (30 mg total) by mouth daily. Start taking on: August 21, 2024   nitroGLYCERIN 0.4 MG SL tablet Commonly known as: NITROSTAT Place 1 tablet (0.4 mg total) under the tongue every 5 (five) minutes as needed for chest pain.   Trelegy Ellipta 100-62.5-25 MCG/ACT Aepb Generic drug: Fluticasone-Umeclidin-Vilant Inhale 1 puff into the lungs daily at 6 (six) AM.        Follow-up Information     Sabrina, Keller BROCKS, MD Follow up on 08/27/2024.   Specialty: Cardiology Why: at 10:45 please arrive 15 minutes early Contact information: [address] [place] [address] [phone]         Sabrina, Hanson GRADE, MD Follow up in 1 week(s).   Specialty: Family Medicine Contact information: [address] [place] [address] [phone]         Pa, Sabrina Hanson Follow up in 1 week(s).   Contact information: [address] [place]  [address] [phone]                Allergies  Allergen Reactions   Other Other (See Comments)    PATIENT IS EXTREMELY SENSITIVE TO ALL PAIN MEDICATIONS PER FAMILY MEMBERS WHO MANAGE MEDS AT HOME.    Oxycodone Other (See Comments)    Drowsiness, shaking    Pregabalin Anxiety and Other (See Comments)    Altered mental status Altered mental status Altered mental status Altered mental status tremors     Consultations: Cardiology   Procedures/Studies: DG Chest Portable 1 View Result Date: 08/18/2024 EXAM: 1 VIEW(S) XRAY OF THE CHEST 08/18/2024 10:38:00 PM COMPARISON: 04/15/2024 CLINICAL HISTORY: Chest Pain FINDINGS: LUNGS AND PLEURA: Glenoid airspace opacity in the left mid lung likely represents atelectasis or scarring. No pleural effusion. No pneumothorax. HEART AND MEDIASTINUM: Aortic calcification. No acute abnormality of the cardiac silhouette. BONES AND SOFT TISSUES: No acute osseous abnormality. IMPRESSION: 1. No acute cardiopulmonary findings. Electronically  signed by: Norman Gatlin MD 08/18/2024 10:56 PM EST RP Workstation: [id number]   Subjective: Patient was seen and examined at bedside.  Overnight events noted. Patient denies any chest pain she feels better and wants to be discharged home.  Discharge Exam: Vitals:   08/20/24 0900 08/20/24 1210  BP: (!) 104/47   Pulse: 75 67  Resp: 18   Temp:    SpO2: 100% 100%   Vitals:   08/19/24 2321 08/20/24 0500 08/20/24 0900 08/20/24 1210  BP: 126/62 (!) 122/54 (!) 104/47   Pulse:   75 67  Resp: 20 18 18    Temp: 97.9 F (36.6 C) 98.5 F (36.9 C)    TempSrc: Oral Oral    SpO2:   100% 100%  Weight:  70 kg    Height:        General: Pt is alert, awake, not in acute distress Cardiovascular: RRR, S1/S2 +, no rubs, no gallops Respiratory: CTA bilaterally, no wheezing, no rhonchi Abdominal: Soft, NT, ND, bowel sounds + Extremities: no edema, no cyanosis    The results of significant diagnostics from this  hospitalization (including imaging, microbiology, ancillary and laboratory) are listed below for reference.     Microbiology: No results found for this or any previous visit (from the past 240 hours).   Labs: BNP (last 3 results) No results for input(s): BNP in the last 8760 hours. Basic Metabolic Panel: Recent Labs  Lab 08/18/24 2221 08/19/24 0637 08/20/24 0442 08/20/24 1052  NA 136 139 138  --   K 5.2* 5.1 5.4* 5.1  CL 114* 115* 113*  --   CO2 15* 17* 17*  --   GLUCOSE 251* 90 82  --   BUN 52* 51* 57*  --   CREATININE 5.02* 4.92* 5.50*  --   CALCIUM  8.4* 8.2* 8.3*  --    Liver Function Tests: No results for input(s): AST, ALT, ALKPHOS, BILITOT, PROT, ALBUMIN  in the last 168 hours. No results for input(s): LIPASE, AMYLASE in the last 168 hours. No results for input(s): AMMONIA in the last 168 hours. CBC: Recent Labs  Lab 08/18/24 2221 08/19/24 0637 08/20/24 0442  WBC 5.3 5.4 4.4  HGB 8.7* 7.8* 7.7*  HCT 30.2* 26.3* 26.4*  MCV 91.5 88.6 89.5  PLT 192 195 174   Cardiac Enzymes: No results for input(s): CKTOTAL, CKMB, CKMBINDEX, TROPONINI in the last 168 hours. BNP: Invalid input(s): POCBNP CBG: Recent Labs  Lab 08/19/24 1723 08/19/24 2219 08/19/24 2317 08/20/24 0849 08/20/24 1248  GLUCAP 97 157* 116* 84 286*   D-Dimer No results for input(s): DDIMER in the last 72 hours. Hgb A1c Recent Labs    08/19/24 0637  HGBA1C 5.8*   Lipid Profile Recent Labs    08/19/24 0637  CHOL 173  HDL 44  LDLCALC 121*  TRIG 39  CHOLHDL 3.9   Thyroid function studies No results for input(s): TSH, T4TOTAL, T3FREE, THYROIDAB in the last 72 hours.  Invalid input(s): FREET3 Anemia work up No results for input(s): VITAMINB12, FOLATE, FERRITIN, TIBC, IRON, RETICCTPCT in the last 72 hours. Urinalysis No results found for: COLORURINE, APPEARANCEUR, LABSPEC, PHURINE, GLUCOSEU, HGBUR, BILIRUBINUR,  KETONESUR, PROTEINUR, UROBILINOGEN, NITRITE, LEUKOCYTESUR Sepsis Labs Recent Labs  Lab 08/18/24 2221 08/19/24 0637 08/20/24 0442  WBC 5.3 5.4 4.4   Microbiology No results found for this or any previous visit (from the past 240 hours).   Time coordinating discharge: Over 30 minutes  SIGNED:   Darcel Dawley, MD  [facility] 08/20/2024, 2:29 PM  Pager   If 7PM-7AM, please contact night-coverage

## 2024-08-20 NOTE — Progress Notes (Addendum)
 Cardiology Admission History and Physical:    Patient ID: Cale Wooton MRN: [id number]; DOB: Sep 12, 1946   Admission date: 08/18/2024  Primary Care Provider: [name], [name], MD Primary Cardiologist:[facility]  Chief Complaint:  Chest pain  Patient Profile:   Sabrina Hanson is a 78 y.o. female with history of NSTEMI, multivessel CAD under medical management, HFpEF, CKD V, COPD, TIIDM, HTN, HLD, who was admitted on 12/1 with chest pain and concern for NSTEMI.   Interval history:   Sabrina Hanson reports that she has been chest pain free since yesterday. Reports that she has been urinating a lot. Has some shortness of breath that is comparable to her baseline. Denies peripheral swelling, arm or jaw, pain, syncope, lightheadedness, nausea and vomiting.    Physical Exam/Data:   Vitals:   08/19/24 1541 08/19/24 2002 08/19/24 2321 08/20/24 0500  BP: (!) 115/51 (!) 109/56 126/62 (!) 122/54  Pulse: (!) 58     Resp: 18 18 20 18   Temp: 97.8 F (36.6 C) 97.6 F (36.4 C) 97.9 F (36.6 C) 98.5 F (36.9 C)  TempSrc: Oral Oral Oral Oral  SpO2: 100%     Weight:    70 kg  Height:       No intake or output data in the 24 hours ending 08/20/24 0808    08/20/2024    5:00 AM 08/19/2024    4:40 AM 08/18/2024   10:21 PM  Last 3 Weights  Weight (lbs) 154 lb 4.8 oz 155 lb 13.8 oz 162 lb  Weight (kg) 69.99 kg 70.7 kg 73.483 kg     Body mass index is 28.22 kg/m.   Physical Exam Constitutional:      General: She is not in acute distress.    Appearance: She is obese.  Cardiovascular:     Rate and Rhythm: Normal rate and regular rhythm.     Heart sounds: Normal heart sounds. No murmur heard.    No friction rub. No gallop.  Pulmonary:     Effort: No tachypnea.     Breath sounds: Normal breath sounds.  Musculoskeletal:     Right lower leg: No edema.     Left lower leg: No edema.  Neurological:     Mental Status: She is alert.      EKG:  The ECG that was done 12/2 was personally  reviewed and demonstrates Normal sinus rhythm with no ischemic changes  Relevant CV Studies: Echo from 7/31 with EF >55 with Grade 2 diastolic dysfunction, moderate mitral stenosis   RHC from 8/1 showed normal RHC filling pressures     Left heart cath at [facility] showed left main 70% stenosis, mid LAD 50%, diagonal 90% ostial, ostial left circumflex 70%, mid left circumflex 75% and occluded RCA.   Laboratory Data:  Chemistry Recent Labs  Lab 08/19/24 [phone] 08/20/24 0442  NA 139 138  K 5.1 5.4*  CL 115* 113*  CO2 17* 17*  GLUCOSE 90 82  BUN 51* 57*  CREATININE 4.92* 5.50*  CALCIUM  8.2* 8.3*  GFRNONAA 9* 8*  ANIONGAP 7 8    No results for input(s): PROT, ALBUMIN , AST, ALT, ALKPHOS, BILITOT in the last 168 hours. Hematology Recent Labs  Lab 08/19/24 0637 08/20/24 0442  WBC 5.4 4.4  RBC 2.97* 2.95*  HGB 7.8* 7.7*  HCT 26.3* 26.4*  MCV 88.6 89.5  MCH 26.3 26.1  MCHC 29.7* 29.2*  RDW 15.8* 15.9*  PLT 195 174   Cardiac Enzymes  Latest Reference Range & Units  08/19/24 02:44 08/19/24 06:37  Troponin I (High Sensitivity) <18 ng/L 69 (H) 97 (H)  (H): Data is abnormally high   Radiology/Studies:  No results found.   Assessment and Plan:   NSTEMI Medical non-compliance Elevated troponin in the setting of highly suspicious history and know CAD. Had been medically non-compliant with medications and thus was not a candidate for potential PCI. This morning she remains chest pain free without any ischemic changes on her EKG. Likely that her poor kidney function is contributory to her poor troponin clearance. Will continue with medical optimization for now and defer further operative or catheter-directed intervention at this time, considering she is chest pain free with medical therapy now.  -Continue aspirin  81mg  daily -Continue Plavix  75mg  daily -Continue Atorvastatin 80mg  daily -Refill medications as needed with 90 day supply, patient states she is in need of  refills. -F/U with outpatient cardiologist at [facility] clinic, 12/9 10:45AM -Nitroglycerin as needed, has not needed since yesterday morning. -OK for discharge from a cardiology perspective   HFpEF Last Echo 7/31 with EF>55% and minimal valvular disease. No signs of exacerbation at this point and she is on her home O2 of 2L. Outpatient regimen of Lasix 60mg  BID and Coreg 25mg  BID. Afterload reduction with Imdur, which was recently added at an outpatient cardiology visit. No urine output has been charted but the patient reports that she has been peeing. -Continue Coreg 25mg  BID -Continue Lasix 60mg  BID   HTN BP 185/65 on admission. Improved to 122/54 this morning with Imdur and Coreg, which is her home regimen -Continue Coreg 25mg  BID -Continue Imdur 30mg  daily -Monitor BP   Per primary team CKD V Hyperkalemia Metabolic acidosis COPD Type II DM Anemia   For questions or updates, please contact [facility] Please consult [email or web address] for contact info under       Signed, Melvenia Morrison, MD  08/20/2024 8:08 AM    ATTENDING ATTESTATION:  After conducting a review of all available clinical information with the care team, interviewing the patient, and performing a physical exam, I agree with the findings and plan described in this note.   GEN: No acute distress, AO x 3 HEENT:  MMM, no JVD, no scleral icterus Cardiac: RRR, no murmurs, rubs, or gallops.  Respiratory: Clear to auscultation bilaterally. GI: Soft, nontender, non-distended  MS: No edema; No deformity. Neuro:  Nonfocal  Vasc:  +2 radial pulses  Patient remains chest pain free on GDMT.  HR and BP are both well controlled.  Patient ambulating without angina.  Discharge today on current medical therapy.  I spoke to patient about importance of compliance with medical therapy and close cardiology follow up.  Have arranged follow up with [facility] in one week.  Will sign off, call with ?s  Lurena Red, MD Pager [phone]

## 2024-08-20 NOTE — Discharge Instructions (Signed)
 Advised to follow up Cardiology in [facility] in one week. Advised to follow up Nephrology as scheduled. Advised medical management.

## 2024-08-20 NOTE — Plan of Care (Signed)
  Problem: Education: Goal: Ability to describe self-care measures that may prevent or decrease complications (Diabetes Survival Skills Education) will improve Outcome: Adequate for Discharge Goal: Individualized Educational Video(s) Outcome: Adequate for Discharge   Problem: Coping: Goal: Ability to adjust to condition or change in health will improve Outcome: Adequate for Discharge   Problem: Fluid Volume: Goal: Ability to maintain a balanced intake and output will improve Outcome: Adequate for Discharge   Problem: Health Behavior/Discharge Planning: Goal: Ability to identify and utilize available resources and services will improve Outcome: Adequate for Discharge Goal: Ability to manage health-related needs will improve Outcome: Adequate for Discharge   Problem: Metabolic: Goal: Ability to maintain appropriate glucose levels will improve Outcome: Adequate for Discharge   Problem: Nutritional: Goal: Maintenance of adequate nutrition will improve Outcome: Adequate for Discharge Goal: Progress toward achieving an optimal weight will improve Outcome: Adequate for Discharge   Problem: Skin Integrity: Goal: Risk for impaired skin integrity will decrease Outcome: Adequate for Discharge   Problem: Tissue Perfusion: Goal: Adequacy of tissue perfusion will improve Outcome: Adequate for Discharge   Problem: Education: Goal: Understanding of cardiac disease, CV risk reduction, and recovery process will improve Outcome: Adequate for Discharge Goal: Individualized Educational Video(s) Outcome: Adequate for Discharge   Problem: Activity: Goal: Ability to tolerate increased activity will improve Outcome: Adequate for Discharge   Problem: Cardiac: Goal: Ability to achieve and maintain adequate cardiovascular perfusion will improve Outcome: Adequate for Discharge   Problem: Health Behavior/Discharge Planning: Goal: Ability to safely manage health-related needs after discharge  will improve Outcome: Adequate for Discharge   Problem: Education: Goal: Knowledge of General Education information will improve Description: Including pain rating scale, medication(s)/side effects and non-pharmacologic comfort measures Outcome: Adequate for Discharge   Problem: Health Behavior/Discharge Planning: Goal: Ability to manage health-related needs will improve Outcome: Adequate for Discharge   Problem: Clinical Measurements: Goal: Ability to maintain clinical measurements within normal limits will improve Outcome: Adequate for Discharge Goal: Will remain free from infection Outcome: Adequate for Discharge Goal: Diagnostic test results will improve Outcome: Adequate for Discharge Goal: Respiratory complications will improve Outcome: Adequate for Discharge Goal: Cardiovascular complication will be avoided Outcome: Adequate for Discharge   Problem: Activity: Goal: Risk for activity intolerance will decrease Outcome: Adequate for Discharge   Problem: Nutrition: Goal: Adequate nutrition will be maintained Outcome: Adequate for Discharge   Problem: Coping: Goal: Level of anxiety will decrease Outcome: Adequate for Discharge   Problem: Elimination: Goal: Will not experience complications related to bowel motility Outcome: Adequate for Discharge Goal: Will not experience complications related to urinary retention Outcome: Adequate for Discharge   Problem: Pain Managment: Goal: General experience of comfort will improve and/or be controlled Outcome: Adequate for Discharge   Problem: Safety: Goal: Ability to remain free from injury will improve Outcome: Adequate for Discharge   Problem: Skin Integrity: Goal: Risk for impaired skin integrity will decrease Outcome: Adequate for Discharge

## 2024-08-20 NOTE — Plan of Care (Signed)

## 2024-08-27 ENCOUNTER — Inpatient Hospital Stay (HOSPITAL_COMMUNITY)

## 2024-08-27 ENCOUNTER — Inpatient Hospital Stay (HOSPITAL_COMMUNITY)
Admission: EM | Admit: 2024-08-27 | Discharge: 2024-09-01 | DRG: 291 | Disposition: A | Attending: Internal Medicine | Admitting: Internal Medicine

## 2024-08-27 ENCOUNTER — Emergency Department (HOSPITAL_COMMUNITY)

## 2024-08-27 ENCOUNTER — Encounter (HOSPITAL_COMMUNITY): Payer: Self-pay | Admitting: Family Medicine

## 2024-08-27 ENCOUNTER — Other Ambulatory Visit: Payer: Self-pay

## 2024-08-27 DIAGNOSIS — Z7951 Long term (current) use of inhaled steroids: Secondary | ICD-10-CM | POA: Diagnosis not present

## 2024-08-27 DIAGNOSIS — I25110 Atherosclerotic heart disease of native coronary artery with unstable angina pectoris: Secondary | ICD-10-CM | POA: Diagnosis present

## 2024-08-27 DIAGNOSIS — J449 Chronic obstructive pulmonary disease, unspecified: Secondary | ICD-10-CM | POA: Diagnosis present

## 2024-08-27 DIAGNOSIS — N179 Acute kidney failure, unspecified: Secondary | ICD-10-CM | POA: Diagnosis present

## 2024-08-27 DIAGNOSIS — D631 Anemia in chronic kidney disease: Secondary | ICD-10-CM | POA: Diagnosis present

## 2024-08-27 DIAGNOSIS — E042 Nontoxic multinodular goiter: Secondary | ICD-10-CM | POA: Diagnosis present

## 2024-08-27 DIAGNOSIS — I5031 Acute diastolic (congestive) heart failure: Secondary | ICD-10-CM | POA: Diagnosis not present

## 2024-08-27 DIAGNOSIS — I200 Unstable angina: Principal | ICD-10-CM | POA: Diagnosis present

## 2024-08-27 DIAGNOSIS — E079 Disorder of thyroid, unspecified: Secondary | ICD-10-CM | POA: Diagnosis not present

## 2024-08-27 DIAGNOSIS — M549 Dorsalgia, unspecified: Secondary | ICD-10-CM | POA: Diagnosis present

## 2024-08-27 DIAGNOSIS — N185 Chronic kidney disease, stage 5: Secondary | ICD-10-CM | POA: Diagnosis not present

## 2024-08-27 DIAGNOSIS — I132 Hypertensive heart and chronic kidney disease with heart failure and with stage 5 chronic kidney disease, or end stage renal disease: Secondary | ICD-10-CM | POA: Diagnosis present

## 2024-08-27 DIAGNOSIS — I5032 Chronic diastolic (congestive) heart failure: Secondary | ICD-10-CM | POA: Diagnosis present

## 2024-08-27 DIAGNOSIS — I10 Essential (primary) hypertension: Secondary | ICD-10-CM | POA: Diagnosis present

## 2024-08-27 DIAGNOSIS — E785 Hyperlipidemia, unspecified: Secondary | ICD-10-CM | POA: Diagnosis present

## 2024-08-27 DIAGNOSIS — Z992 Dependence on renal dialysis: Secondary | ICD-10-CM | POA: Diagnosis not present

## 2024-08-27 DIAGNOSIS — E872 Acidosis, unspecified: Secondary | ICD-10-CM | POA: Diagnosis present

## 2024-08-27 DIAGNOSIS — N186 End stage renal disease: Secondary | ICD-10-CM | POA: Diagnosis present

## 2024-08-27 DIAGNOSIS — Z951 Presence of aortocoronary bypass graft: Secondary | ICD-10-CM | POA: Diagnosis not present

## 2024-08-27 DIAGNOSIS — E1122 Type 2 diabetes mellitus with diabetic chronic kidney disease: Secondary | ICD-10-CM | POA: Diagnosis present

## 2024-08-27 DIAGNOSIS — R079 Chest pain, unspecified: Secondary | ICD-10-CM | POA: Diagnosis present

## 2024-08-27 DIAGNOSIS — I2510 Atherosclerotic heart disease of native coronary artery without angina pectoris: Secondary | ICD-10-CM | POA: Diagnosis not present

## 2024-08-27 DIAGNOSIS — Z7982 Long term (current) use of aspirin: Secondary | ICD-10-CM | POA: Diagnosis not present

## 2024-08-27 DIAGNOSIS — Z79899 Other long term (current) drug therapy: Secondary | ICD-10-CM | POA: Diagnosis not present

## 2024-08-27 DIAGNOSIS — Z885 Allergy status to narcotic agent status: Secondary | ICD-10-CM | POA: Diagnosis not present

## 2024-08-27 DIAGNOSIS — Z888 Allergy status to other drugs, medicaments and biological substances status: Secondary | ICD-10-CM | POA: Diagnosis not present

## 2024-08-27 DIAGNOSIS — M5410 Radiculopathy, site unspecified: Secondary | ICD-10-CM | POA: Diagnosis present

## 2024-08-27 DIAGNOSIS — E875 Hyperkalemia: Secondary | ICD-10-CM | POA: Diagnosis present

## 2024-08-27 DIAGNOSIS — D649 Anemia, unspecified: Secondary | ICD-10-CM | POA: Diagnosis not present

## 2024-08-27 DIAGNOSIS — Z7902 Long term (current) use of antithrombotics/antiplatelets: Secondary | ICD-10-CM | POA: Diagnosis not present

## 2024-08-27 DIAGNOSIS — F17210 Nicotine dependence, cigarettes, uncomplicated: Secondary | ICD-10-CM | POA: Diagnosis not present

## 2024-08-27 DIAGNOSIS — Z9981 Dependence on supplemental oxygen: Secondary | ICD-10-CM | POA: Diagnosis not present

## 2024-08-27 DIAGNOSIS — J9611 Chronic respiratory failure with hypoxia: Secondary | ICD-10-CM | POA: Diagnosis present

## 2024-08-27 LAB — BASIC METABOLIC PANEL WITH GFR
Anion gap: 9 (ref 5–15)
Anion gap: 11 (ref 5–15)
Anion gap: 8 (ref 5–15)
BUN: 78 mg/dL — ABNORMAL HIGH (ref 8–23)
BUN: 68 mg/dL — ABNORMAL HIGH (ref 8–23)
BUN: 75 mg/dL — ABNORMAL HIGH (ref 8–23)
CO2: 15 mmol/L — ABNORMAL LOW (ref 22–32)
CO2: 10 mmol/L — ABNORMAL LOW (ref 22–32)
CO2: 18 mmol/L — ABNORMAL LOW (ref 22–32)
Calcium: 7.5 mg/dL — ABNORMAL LOW (ref 8.9–10.3)
Calcium: 6.7 mg/dL — ABNORMAL LOW (ref 8.9–10.3)
Calcium: 7.9 mg/dL — ABNORMAL LOW (ref 8.9–10.3)
Chloride: 112 mmol/L — ABNORMAL HIGH (ref 98–111)
Chloride: 120 mmol/L — ABNORMAL HIGH (ref 98–111)
Chloride: 113 mmol/L — ABNORMAL HIGH (ref 98–111)
Creatinine, Ser: 6.71 mg/dL — ABNORMAL HIGH (ref 0.44–1.00)
Creatinine, Ser: 5.68 mg/dL — ABNORMAL HIGH (ref 0.44–1.00)
Creatinine, Ser: 6.52 mg/dL — ABNORMAL HIGH (ref 0.44–1.00)
GFR, Estimated: 6 mL/min — ABNORMAL LOW (ref >60)
GFR, Estimated: 7 mL/min — ABNORMAL LOW (ref >60)
GFR, Estimated: 6 mL/min — ABNORMAL LOW (ref >60)
Glucose, Bld: 201 mg/dL — ABNORMAL HIGH (ref 70–99)
Glucose, Bld: 109 mg/dL — ABNORMAL HIGH (ref 70–99)
Glucose, Bld: 95 mg/dL (ref 70–99)
Potassium: 5.8 mmol/L — ABNORMAL HIGH (ref 3.5–5.1)
Potassium: 5.5 mmol/L — ABNORMAL HIGH (ref 3.5–5.1)
Potassium: 5.4 mmol/L — ABNORMAL HIGH (ref 3.5–5.1)
Sodium: 136 mmol/L (ref 135–145)
Sodium: 141 mmol/L (ref 135–145)
Sodium: 139 mmol/L (ref 135–145)

## 2024-08-27 LAB — URINALYSIS, COMPLETE (UACMP) WITH MICROSCOPIC
Bacteria, UA: NONE SEEN
Bilirubin Urine: NEGATIVE
Glucose, UA: 50 mg/dL — AB
Ketones, ur: NEGATIVE mg/dL
Leukocytes,Ua: NEGATIVE
Nitrite: NEGATIVE
Protein, ur: 100 mg/dL — AB
Specific Gravity, Urine: 1.008 (ref 1.005–1.030)
pH: 5 (ref 5.0–8.0)

## 2024-08-27 LAB — CBC
HCT: 23.5 % — ABNORMAL LOW (ref 36.0–46.0)
HCT: 20.1 % — ABNORMAL LOW (ref 36.0–46.0)
HCT: 32.6 % — ABNORMAL LOW (ref 36.0–46.0)
Hemoglobin: 6.9 g/dL — CL (ref 12.0–15.0)
Hemoglobin: 5.9 g/dL — CL (ref 12.0–15.0)
Hemoglobin: 9.9 g/dL — ABNORMAL LOW (ref 12.0–15.0)
MCH: 26.6 pg (ref 26.0–34.0)
MCH: 26.9 pg (ref 26.0–34.0)
MCH: 27 pg (ref 26.0–34.0)
MCHC: 29.4 g/dL — ABNORMAL LOW (ref 30.0–36.0)
MCHC: 29.4 g/dL — ABNORMAL LOW (ref 30.0–36.0)
MCHC: 30.4 g/dL (ref 30.0–36.0)
MCV: 90.7 fL (ref 80.0–100.0)
MCV: 91.8 fL (ref 80.0–100.0)
MCV: 88.8 fL (ref 80.0–100.0)
Platelets: 184 K/uL (ref 150–400)
Platelets: 165 K/uL (ref 150–400)
Platelets: 160 K/uL (ref 150–400)
RBC: 2.59 MIL/uL — ABNORMAL LOW (ref 3.87–5.11)
RBC: 2.19 MIL/uL — ABNORMAL LOW (ref 3.87–5.11)
RBC: 3.67 MIL/uL — ABNORMAL LOW (ref 3.87–5.11)
RDW: 16.1 % — ABNORMAL HIGH (ref 11.5–15.5)
RDW: 16.4 % — ABNORMAL HIGH (ref 11.5–15.5)
RDW: 15.7 % — ABNORMAL HIGH (ref 11.5–15.5)
WBC: 5.3 K/uL (ref 4.0–10.5)
WBC: 4.7 K/uL (ref 4.0–10.5)
WBC: 5.5 K/uL (ref 4.0–10.5)
nRBC: 0 % (ref 0.0–0.2)
nRBC: 0 % (ref 0.0–0.2)
nRBC: 0 % (ref 0.0–0.2)

## 2024-08-27 LAB — CBG MONITORING, ED
Glucose-Capillary: 78 mg/dL (ref 70–99)
Glucose-Capillary: 93 mg/dL (ref 70–99)
Glucose-Capillary: 97 mg/dL (ref 70–99)

## 2024-08-27 LAB — TROPONIN I (HIGH SENSITIVITY)
Troponin I (High Sensitivity): 9 ng/L
Troponin I (High Sensitivity): 8 ng/L (ref <18)

## 2024-08-27 LAB — ABO/RH: ABO/RH(D): O POS

## 2024-08-27 LAB — GLUCOSE, CAPILLARY: Glucose-Capillary: 122 mg/dL — ABNORMAL HIGH (ref 70–99)

## 2024-08-27 LAB — PREPARE RBC (CROSSMATCH)

## 2024-08-27 LAB — CREATININE, URINE, RANDOM: Creatinine, Urine: 45 mg/dL

## 2024-08-27 LAB — T4, FREE: Free T4: 0.63 ng/dL (ref 0.61–1.12)

## 2024-08-27 LAB — SODIUM, URINE, RANDOM: Sodium, Ur: 105 mmol/L

## 2024-08-27 LAB — TSH: TSH: 0.657 u[IU]/mL (ref 0.350–4.500)

## 2024-08-27 MED ORDER — SODIUM BICARBONATE 650 MG PO TABS
650.0000 mg | ORAL_TABLET | Freq: Two times a day (BID) | ORAL | Status: DC
  Administered 2024-08-27 – 2024-08-29 (×5): 650 mg via ORAL
  Filled 2024-08-27 (×5): qty 1

## 2024-08-27 MED ORDER — SODIUM CHLORIDE 0.9% FLUSH
3.0000 mL | Freq: Two times a day (BID) | INTRAVENOUS | Status: DC
  Administered 2024-08-27 – 2024-09-01 (×12): 3 mL via INTRAVENOUS

## 2024-08-27 MED ORDER — CARVEDILOL 25 MG PO TABS
25.0000 mg | ORAL_TABLET | Freq: Two times a day (BID) | ORAL | Status: DC
  Administered 2024-08-27 – 2024-09-01 (×11): 25 mg via ORAL
  Filled 2024-08-27 (×5): qty 1
  Filled 2024-08-27 (×2): qty 2
  Filled 2024-08-27 (×4): qty 1

## 2024-08-27 MED ORDER — ACETAMINOPHEN 650 MG RE SUPP: 650.0000 mg | Freq: Four times a day (QID) | RECTAL | Status: DC | PRN

## 2024-08-27 MED ORDER — FENTANYL CITRATE (PF) 50 MCG/ML IJ SOSY: 12.5000 ug | PREFILLED_SYRINGE | INTRAMUSCULAR | Status: DC | PRN

## 2024-08-27 MED ORDER — ONDANSETRON HCL 4 MG/2ML IJ SOLN: 4.0000 mg | Freq: Four times a day (QID) | INTRAMUSCULAR | Status: DC | PRN

## 2024-08-27 MED ORDER — NITROGLYCERIN 2 % TD OINT
0.5000 [in_us] | TOPICAL_OINTMENT | Freq: Once | TRANSDERMAL | Status: AC
  Administered 2024-08-27: 0.5 [in_us] via TOPICAL
  Filled 2024-08-27: qty 1

## 2024-08-27 MED ORDER — ATORVASTATIN CALCIUM 80 MG PO TABS
80.0000 mg | ORAL_TABLET | Freq: Every day | ORAL | Status: DC
  Administered 2024-08-27 – 2024-09-01 (×6): 80 mg via ORAL
  Filled 2024-08-27 (×4): qty 1
  Filled 2024-08-27: qty 2
  Filled 2024-08-27: qty 1

## 2024-08-27 MED ORDER — ASPIRIN 81 MG PO TBEC
81.0000 mg | DELAYED_RELEASE_TABLET | Freq: Every day | ORAL | Status: DC
  Administered 2024-08-27 – 2024-09-01 (×6): 81 mg via ORAL
  Filled 2024-08-27 (×6): qty 1

## 2024-08-27 MED ORDER — INSULIN ASPART 100 UNIT/ML IJ SOLN
0.0000 [IU] | Freq: Every day | INTRAMUSCULAR | Status: DC
  Administered 2024-08-31: 2 [IU] via SUBCUTANEOUS
  Filled 2024-08-27: qty 1

## 2024-08-27 MED ORDER — SODIUM CHLORIDE 0.9% IV SOLUTION: Freq: Once | INTRAVENOUS | Status: DC

## 2024-08-27 MED ORDER — CLOPIDOGREL BISULFATE 75 MG PO TABS
75.0000 mg | ORAL_TABLET | Freq: Every day | ORAL | Status: DC
  Administered 2024-08-27 – 2024-08-31 (×5): 75 mg via ORAL
  Filled 2024-08-27 (×5): qty 1

## 2024-08-27 MED ORDER — ONDANSETRON HCL 4 MG PO TABS: 4.0000 mg | ORAL_TABLET | Freq: Four times a day (QID) | ORAL | Status: DC | PRN

## 2024-08-27 MED ORDER — INSULIN ASPART 100 UNIT/ML IJ SOLN
0.0000 [IU] | Freq: Three times a day (TID) | INTRAMUSCULAR | Status: DC
  Administered 2024-08-29: 1 [IU] via SUBCUTANEOUS
  Filled 2024-08-27: qty 1
  Filled 2024-08-27: qty 4

## 2024-08-27 MED ORDER — ACETAMINOPHEN 325 MG PO TABS
650.0000 mg | ORAL_TABLET | Freq: Four times a day (QID) | ORAL | Status: DC | PRN
  Administered 2024-08-28: 650 mg via ORAL
  Filled 2024-08-27: qty 2

## 2024-08-27 MED ORDER — HEPARIN SODIUM (PORCINE) 5000 UNIT/ML IJ SOLN
5000.0000 [IU] | Freq: Three times a day (TID) | INTRAMUSCULAR | Status: DC
  Administered 2024-08-27 – 2024-09-01 (×16): 5000 [IU] via SUBCUTANEOUS
  Filled 2024-08-27 (×16): qty 1

## 2024-08-27 MED ORDER — SENNA 8.6 MG PO TABS: 1.0000 | ORAL_TABLET | Freq: Every day | ORAL | Status: AC | PRN

## 2024-08-27 MED ORDER — BUDESON-GLYCOPYRROL-FORMOTEROL 160-9-4.8 MCG/ACT IN AERO
2.0000 | INHALATION_SPRAY | Freq: Two times a day (BID) | RESPIRATORY_TRACT | Status: DC
  Administered 2024-08-27 – 2024-09-01 (×9): 2 via RESPIRATORY_TRACT
  Filled 2024-08-27: qty 5.9

## 2024-08-27 MED ORDER — SODIUM ZIRCONIUM CYCLOSILICATE 10 G PO PACK
10.0000 g | PACK | Freq: Once | ORAL | Status: AC
  Administered 2024-08-27: 10 g via ORAL
  Filled 2024-08-27: qty 1

## 2024-08-27 MED ORDER — PANTOPRAZOLE SODIUM 40 MG PO TBEC
40.0000 mg | DELAYED_RELEASE_TABLET | Freq: Two times a day (BID) | ORAL | Status: DC
  Administered 2024-08-27 – 2024-09-01 (×9): 40 mg via ORAL
  Filled 2024-08-27 (×9): qty 1

## 2024-08-27 MED ORDER — FUROSEMIDE 20 MG PO TABS: 60.0000 mg | ORAL_TABLET | Freq: Two times a day (BID) | ORAL | Status: DC

## 2024-08-27 NOTE — H&P (Signed)
 History and Physical    Sabrina Hanson FMW:[id number] DOB: 02/04/1946 DOA: 08/27/2024  PCP: Barba [name], MD   Patient coming from: Home   Chief Complaint: Chest pain   HPI: Sabrina Hanson is a 78 y.o. female with medical history significant for hypertension, hyperlipidemia, type 2 diabetes mellitus, COPD, chronic Evoxac respiratory failure, chronic HFpEF, CKD stage V not on dialysis, and multivessel CAD managed medically who presents with chest pain.  Patient reports developing chest pain yesterday while at rest that eventually resolved after 30 minutes or so.  She had a recurrence overnight that persisted, prompting her to call EMS.  She was treated with nitroglycerin  prior to arrival with significant improvement and eventual resolution in her pain.  She was pain-free in the ED until she ambulated to the restroom which resulted in recurrent chest discomfort.  She describes a throbbing discomfort with radiation to the jaw and left arm.  She feels that her symptoms are worse with activity and better with rest and/or nitroglycerin .  She denies any abdominal pain, nausea, vomiting, diarrhea, melena, or hematochezia.  ED Course: Upon arrival to the ED, patient is found to be afebrile and saturating well on her usual 2 L/min supplemental oxygen with normal RR, normal HR, and stable BP.  Labs are most notable for potassium 5.8, bicarbonate 15, BUN 78, creatinine 6.71, glucose 201, normal WBC, normal troponin, and hemoglobin 6.9.  Chest x-ray is negative for acute cardiopulmonary disease but notable for suspected thyroid  mass.   Type and screen was performed and the patient was treated with Lokelma  and nitroglycerin  ointment in the ED.  Review of Systems:  All other systems reviewed and apart from HPI, are negative.  Past Medical History:  Diagnosis Date   Allergic rhinitis    Chronic heart failure with preserved ejection fraction (HFpEF) (HCC) 08/19/2024   Chronic respiratory failure with  hypoxia (HCC) 08/19/2024   CKD (chronic kidney disease) stage 5, GFR less than 15 ml/min (HCC) 08/19/2024   COPD (chronic obstructive pulmonary disease) (HCC)    Diabetes mellitus without complication (HCC)    HLD (hyperlipidemia)    Hypertension    Migraine    Peripheral neuropathy     Past Surgical History:  Procedure Laterality Date   NO PAST SURGERIES      Social History:   reports that she has been smoking. She has never used smokeless tobacco. She reports that she does not drink alcohol and does not use drugs.  Allergies  Allergen Reactions   Other Other (See Comments)    PATIENT IS EXTREMELY SENSITIVE TO ALL PAIN MEDICATIONS PER FAMILY MEMBERS WHO MANAGE MEDS AT HOME.    Oxycodone Other (See Comments)    Drowsiness, shaking    Pregabalin Anxiety and Other (See Comments)    Altered mental status tremors     Family History  Problem Relation Age of Onset   Neurofibromatosis Neg Hx      Prior to Admission medications   Medication Sig Start Date End Date Taking? Authorizing Provider  aspirin  EC 81 MG tablet Take 1 tablet (81 mg total) by mouth daily. 08/20/24   Leotis Bogus, MD  atorvastatin  (LIPITOR ) 80 MG tablet Take 1 tablet (80 mg total) by mouth daily. 08/20/24   Leotis Bogus, MD  carvedilol  (COREG ) 25 MG tablet Take 1 tablet (25 mg total) by mouth 2 (two) times daily with a meal. 08/20/24   Leotis Bogus, MD  clopidogrel  (PLAVIX ) 75 MG tablet Take 1 tablet (75 mg total) by  mouth daily. 08/20/24   Leotis Bogus, MD  docusate sodium (COLACE) 100 MG capsule Take 100 mg by mouth at bedtime.    [provider]  fluticasone (FLONASE) 50 MCG/ACT nasal spray Place 1 spray into both nostrils daily as needed for allergies or rhinitis. 03/21/24   [provider]  Fluticasone-Umeclidin-Vilant (TRELEGY ELLIPTA) 100-62.5-25 MCG/ACT AEPB Inhale 1 puff into the lungs daily at 6 (six) AM.    [provider]  furosemide  (LASIX ) 20 MG tablet Take 3  tablets (60 mg total) by mouth 2 (two) times daily. 08/20/24   Leotis Bogus, MD  isosorbide  mononitrate (IMDUR ) 30 MG 24 hr tablet Take 1 tablet (30 mg total) by mouth daily. 08/21/24   Leotis Bogus, MD  nitroGLYCERIN  (NITROSTAT ) 0.4 MG SL tablet Place 1 tablet (0.4 mg total) under the tongue every 5 (five) minutes as needed for chest pain. 08/20/24 08/20/25  Leotis Bogus, MD  sodium bicarbonate  650 MG tablet Take 650 mg by mouth 2 (two) times daily.    [provider]    Physical Exam: Vitals:   08/27/24 0157 08/27/24 0200 08/27/24 0230 08/27/24 0300  BP:  (!) 130/57 (!) 121/57 (!) 125/54  Pulse:  69 69 65  Resp:  16 17 18   Temp:  98.6 F (37 C)    SpO2:  100% 100% 100%  Weight: 68.9 kg     Height: 5' 2 (1.575 m)        Constitutional: NAD, no pallor or diaphoresis   Eyes: PERTLA, lids and conjunctivae normal ENMT: Mucous membranes are moist. Posterior pharynx clear of any exudate or lesions.   Neck: supple, no masses  Respiratory: no wheezing, no crackles. No accessory muscle use.  Cardiovascular: S1 & S2 heard, regular rate and rhythm. Trace lower extremity edema.  Abdomen: No tenderness, soft. Bowel sounds active.  Musculoskeletal: no clubbing / cyanosis. No joint deformity upper and lower extremities.   Skin: no significant rashes, lesions, ulcers. Warm, dry, well-perfused. Neurologic: CN 2-12 grossly intact. Moving all extremities. Alert and oriented.  Psychiatric: Calm. Cooperative.    Labs and Imaging on Admission: I have personally reviewed following labs and imaging studies  CBC: Recent Labs  Lab 08/20/24 0442 08/27/24 0204  WBC 4.4 5.3  HGB 7.7* 6.9*  HCT 26.4* 23.5*  MCV 89.5 90.7  PLT 174 184   Basic Metabolic Panel: Recent Labs  Lab 08/20/24 0442 08/20/24 1052 08/27/24 0204  NA 138  --  136  K 5.4* 5.1 5.8*  CL 113*  --  112*  CO2 17*  --  15*  GLUCOSE 82  --  201*  BUN 57*  --  78*  CREATININE 5.50*  --  6.71*  CALCIUM  8.3*  --   7.5*   GFR: Estimated Creatinine Clearance: 6.5 mL/min (A) (by C-G formula based on SCr of 6.71 mg/dL (H)). Liver Function Tests: No results for input(s): AST, ALT, ALKPHOS, BILITOT, PROT, ALBUMIN  in the last 168 hours. No results for input(s): LIPASE, AMYLASE in the last 168 hours. No results for input(s): AMMONIA in the last 168 hours. Coagulation Profile: No results for input(s): INR, PROTIME in the last 168 hours. Cardiac Enzymes: No results for input(s): CKTOTAL, CKMB, CKMBINDEX, TROPONINI in the last 168 hours. BNP (last 3 results) No results for input(s): PROBNP in the last 8760 hours. HbA1C: No results for input(s): HGBA1C in the last 72 hours. CBG: Recent Labs  Lab 08/20/24 0849 08/20/24 1248  GLUCAP 84 286*   Lipid Profile:  No results for input(s): CHOL, HDL, LDLCALC, TRIG, CHOLHDL, LDLDIRECT in the last 72 hours. Thyroid  Function Tests: No results for input(s): TSH, T4TOTAL, FREET4, T3FREE, THYROIDAB in the last 72 hours. Anemia Panel: No results for input(s): VITAMINB12, FOLATE, FERRITIN, TIBC, IRON, RETICCTPCT in the last 72 hours. Urine analysis: No results found for: COLORURINE, APPEARANCEUR, LABSPEC, PHURINE, GLUCOSEU, HGBUR, BILIRUBINUR, KETONESUR, PROTEINUR, UROBILINOGEN, NITRITE, LEUKOCYTESUR Sepsis Labs: @LABRCNTIP (procalcitonin:4,lacticidven:4) )No results found for this or any previous visit (from the past 240 hours).   Radiological Exams on Admission: DG Chest 2 View Result Date: 08/27/2024 EXAM: 2 VIEW(S) XRAY OF THE CHEST 08/27/2024 02:42:00 AM COMPARISON: Portable chest 08/18/2024. CLINICAL HISTORY: Chest pain. FINDINGS: LUNGS AND PLEURA: Left mid lung linear scarring or atelectasis. Mild chronic interstitial change. No evidence of focal pneumonia. No pleural effusion. No pneumothorax. HEART AND MEDIASTINUM: The aorta is tortuous and calcified. The mediastinum is  stable. The heart is slightly enlarged. No vascular congestion is seen. Smooth mass effect on the right side of the trachea at the level of T1 and T2, most typically this would be due to a thyroid  mass. A nonemergent thyroid  ultrasound is recommended. BONES AND SOFT TISSUES: Osteopenia and thoracic spondylosis. IMPRESSION: 1. No acute cardiopulmonary findings. 2. Smooth right paratracheal extrinsic mass effect at T1 extrahepatic T2, most consistent with a thyroid  mass. Recommend nonemergent thyroid  ultrasound for further characterization. Electronically signed by: Francis Quam MD 08/27/2024 02:51 AM EST RP Workstation: [id number]    EKG: Independently reviewed. Sinus rhythm, similar to prior.   Assessment/Plan   1. Symptomatic anemia  - Hgb is 6.9, down from 7.7 one week ago  - No overt bleeding   - Transfuse 2 units RBC, trend H&H    2. Unstable angina  - In the setting of acute on chronic anemia; initial troponin is normal; EKG similar to prior with no acute ST changes    - Transfuse RBCs, continue DAPT, statin, and beta-blocker   3. AKI superimposed on CKD V; hyperkalemia; metabolic acidosis  - SCr is 6.71 on admission, up from 5.50 one week ago; serum bicarbonate is 15 and potassium 5.8   - She was give Lokelma  in the ED  - Check UA and urine chemistries, renally-dose medications, hold Lasix , continue bicarbonate, use low-potassium diet, repeat chem panel in am   4. Chronic HFpEF  - EF was normal on TTE from July 2025  - Appears compensated  - Hold Lasix  for now, ,monitor weight and I/Os    5. Type II DM  - A1c was 5.8% earlier this month; serum glucose 201 in ED  - Check CBGs and use low-intensity SSI for now    6. COPD; chronic hypoxic respiratory failure  - Not in exacerbation  - Continue ICS-LAMA-LABA and supplemental O2   7. Thyroid  mass  - Noted incidentally on CXR in ED  - Check US     DVT prophylaxis: sq heparin   Code Status: Full  Level of Care: Level of care:  Telemetry Family Communication: None present   Disposition Plan:  Patient is from: Home Anticipated d/c is to: TBD Anticipated d/c date is: 08/29/24  Patient currently: Pending improved/stable H&H and renal function  Consults called: None  Admission status: Inpatient     Evalene GORMAN Sprinkles, MD [facility]  08/27/2024, 3:40 AM

## 2024-08-27 NOTE — ED Notes (Signed)
 CCMD called.

## 2024-08-27 NOTE — ED Notes (Signed)
 Contacted CCMD for monitoring

## 2024-08-27 NOTE — ED Provider Notes (Signed)
 [facility] AT [facility] Provider Note   CSN: [id number] Arrival date & time: 08/27/24  0154     Patient presents with: Chest Pain   Sabrina Hanson is a 78 y.o. female.   The history is provided by the patient.  Patient with extensive history including known CAD, COPD on chronic oxygen, chronic kidney disease presents with chest pain.  Patient reports she has had 2 episodes of chest pain, pressure that radiates to her jaw and left arm.  The first episode occurred yesterday and lasted around 35 minutes and resolved.  The current episode started around midnight and lasted for several hours until she was given nitroglycerin  by EMS.  She is now pain-free.  No fevers or vomiting.  She reports shortness of breath and diaphoresis that is improving.  This feels similar to prior episodes of chest pain     Past Medical History:  Diagnosis Date   Allergic rhinitis    Chronic heart failure with preserved ejection fraction (HFpEF) (HCC) 08/19/2024   Chronic respiratory failure with hypoxia (HCC) 08/19/2024   CKD (chronic kidney disease) stage 5, GFR less than 15 ml/min (HCC) 08/19/2024   COPD (chronic obstructive pulmonary disease) (HCC)    Diabetes mellitus without complication (HCC)    HLD (hyperlipidemia)    Hypertension    Migraine    Peripheral neuropathy      Prior to Admission medications   Medication Sig Start Date End Date Taking? Authorizing Provider  aspirin  EC 81 MG tablet Take 1 tablet (81 mg total) by mouth daily. 08/20/24   Leotis Bogus, MD  atorvastatin  (LIPITOR ) 80 MG tablet Take 1 tablet (80 mg total) by mouth daily. 08/20/24   Leotis Bogus, MD  carvedilol  (COREG ) 25 MG tablet Take 1 tablet (25 mg total) by mouth 2 (two) times daily with a meal. 08/20/24   Leotis Bogus, MD  clopidogrel  (PLAVIX ) 75 MG tablet Take 1 tablet (75 mg total) by mouth daily. 08/20/24   Leotis Bogus, MD  docusate sodium (COLACE) 100 MG capsule Take 100 mg by  mouth at bedtime.    [provider]  fluticasone (FLONASE) 50 MCG/ACT nasal spray Place 1 spray into both nostrils daily as needed for allergies or rhinitis. 03/21/24   [provider]  Fluticasone-Umeclidin-Vilant (TRELEGY ELLIPTA) 100-62.5-25 MCG/ACT AEPB Inhale 1 puff into the lungs daily at 6 (six) AM.    [provider]  furosemide  (LASIX ) 20 MG tablet Take 3 tablets (60 mg total) by mouth 2 (two) times daily. 08/20/24   Leotis Bogus, MD  isosorbide  mononitrate (IMDUR ) 30 MG 24 hr tablet Take 1 tablet (30 mg total) by mouth daily. 08/21/24   Leotis Bogus, MD  nitroGLYCERIN  (NITROSTAT ) 0.4 MG SL tablet Place 1 tablet (0.4 mg total) under the tongue every 5 (five) minutes as needed for chest pain. 08/20/24 08/20/25  Leotis Bogus, MD    Allergies: Other, Oxycodone, and Pregabalin    Review of Systems  Respiratory:  Positive for shortness of breath.   Cardiovascular:  Positive for chest pain.  Gastrointestinal:  Negative for blood in stool.  Genitourinary:  Negative for decreased urine volume and hematuria.    Updated Vital Signs BP (!) 125/54   Pulse 65   Temp 98.6 F (37 C)   Resp 18   Ht 1.575 m (5' 2)   Wt 68.9 kg   SpO2 100%   BMI 27.80 kg/m   Physical Exam CONSTITUTIONAL: Chronically ill-appearing, no acute distress HEAD: Normocephalic/atraumatic  ENMT: Mucous membranes moist NECK: supple no meningeal signs CV: S1/S2 noted, no murmurs/rubs/gallops noted LUNGS: Lungs are clear to auscultation bilaterally, no apparent distress, on chronic oxygen ABDOMEN: soft, nontender NEURO: Pt is awake/alert/appropriate, moves all extremitiesx4.  No facial droop.   EXTREMITIES: pulses normal/equal, full ROM SKIN: warm, color normal PSYCH: no abnormalities of mood noted, alert and oriented to situation  (all labs ordered are listed, but only abnormal results are displayed) Labs Reviewed  BASIC METABOLIC PANEL WITH GFR - Abnormal; Notable for the  following components:      Result Value   Potassium 5.8 (*)    Chloride 112 (*)    CO2 15 (*)    Glucose, Bld 201 (*)    BUN 78 (*)    Creatinine, Ser 6.71 (*)    Calcium  7.5 (*)    GFR, Estimated 6 (*)    All other components within normal limits  CBC - Abnormal; Notable for the following components:   RBC 2.59 (*)    Hemoglobin 6.9 (*)    HCT 23.5 (*)    MCHC 29.4 (*)    RDW 16.1 (*)    All other components within normal limits  ABO/RH  TYPE AND SCREEN  TROPONIN I (HIGH SENSITIVITY)    EKG: EKG Interpretation Date/Time:  Tuesday August 27 2024 02:00:01 EST Ventricular Rate:  69 PR Interval:  201 QRS Duration:  94 QT Interval:  411 QTC Calculation: 441 R Axis:   15  Text Interpretation: Sinus rhythm No significant change since last tracing Confirmed by Midge Golas ([id number]) on 08/27/2024 2:17:48 AM  Radiology: DG Chest 2 View Result Date: 08/27/2024 EXAM: 2 VIEW(S) XRAY OF THE CHEST 08/27/2024 02:42:00 AM COMPARISON: Portable chest 08/18/2024. CLINICAL HISTORY: Chest pain. FINDINGS: LUNGS AND PLEURA: Left mid lung linear scarring or atelectasis. Mild chronic interstitial change. No evidence of focal pneumonia. No pleural effusion. No pneumothorax. HEART AND MEDIASTINUM: The aorta is tortuous and calcified. The mediastinum is stable. The heart is slightly enlarged. No vascular congestion is seen. Smooth mass effect on the right side of the trachea at the level of T1 and T2, most typically this would be due to a thyroid  mass. A nonemergent thyroid  ultrasound is recommended. BONES AND SOFT TISSUES: Osteopenia and thoracic spondylosis. IMPRESSION: 1. No acute cardiopulmonary findings. 2. Smooth right paratracheal extrinsic mass effect at T1 extrahepatic T2, most consistent with a thyroid  mass. Recommend nonemergent thyroid  ultrasound for further characterization. Electronically signed by: Francis Quam MD 08/27/2024 02:51 AM EST RP Workstation: [id number]     .Critical  Care  Performed by: Midge Golas, MD Authorized by: Midge Golas, MD   Critical care provider statement:    Critical care time (minutes):  50   Critical care start time:  08/27/2024 2:30 AM   Critical care end time:  08/27/2024 3:20 AM   Critical care time was exclusive of:  Separately billable procedures and treating other patients   Critical care was necessary to treat or prevent imminent or life-threatening deterioration of the following conditions:  Renal failure and cardiac failure   Critical care was time spent personally by me on the following activities:  Obtaining history from patient or surrogate, examination of patient, evaluation of patient's response to treatment, pulse oximetry, ordering and review of radiographic studies, ordering and review of laboratory studies and development of treatment plan with patient or surrogate   I assumed direction of critical care for this patient from another provider in my specialty: no  Care discussed with: admitting provider      Medications Ordered in the ED  sodium zirconium cyclosilicate  (LOKELMA ) packet 10 g (has no administration in time range)  nitroGLYCERIN  (NITROGLYN) 2 % ointment 0.5 inch (has no administration in time range)    Clinical Course as of 08/27/24 0324  Tue Aug 27, 2024  0309 Patient with extensive history including known CAD, COPD chronic respiratory failure, presents with chest pain that has since resolved.  This is similar to prior episode was recently admitted.  Patient also found to have acute on chronic anemia though denies any recent blood loss.  Also has worsening renal failure and hyperkalemia.  Patient will need to be admitted [DW]  0322 Discussed the case with Dr. Charlton for admission Patient is awake and alert no acute distress. Patient has multiple medical issues that need to be managed prior to any intervention.  May require blood transfusion.  Will also need optimization of her renal failure.  She has  no EKG changes from the hyperkalemia, Lokelma  has been ordered Patient is not on dialysis Will defer aspirin  for now but she denies any bleeding [DW]    Clinical Course User Index [DW] Midge Golas, MD                                 Medical Decision Making Amount and/or Complexity of Data Reviewed Labs: ordered. Radiology: ordered.  Risk Prescription drug management. Decision regarding hospitalization.   This patient presents to the ED for concern of chest pain, this involves an extensive number of treatment options, and is a complaint that carries with it a high risk of complications and morbidity.  The differential diagnosis includes but is not limited to acute coronary syndrome, aortic dissection, pulmonary embolism, pericarditis, pneumothorax, pneumonia, myocarditis, pleurisy, esophageal rupture    Comorbidities that complicate the patient evaluation: Patient's presentation is complicated by their history of CAD, COPD  Social Determinants of Health: Patient's tobacco use history  increases the complexity of managing their presentation  Additional history obtained: Records reviewed previous admission documents  Lab Tests: I Ordered, and personally interpreted labs.  The pertinent results include: Chronic kidney disease, acute on chronic anemia, hyperkalemia  Imaging Studies ordered: I ordered imaging studies including X-ray chest  I independently visualized and interpreted imaging which showed no acute findings I agree with the radiologist interpretation  Cardiac Monitoring: The patient was maintained on a cardiac monitor.  I personally viewed and interpreted the cardiac monitor which showed an underlying rhythm of:  sinus rhythm  Medicines ordered and prescription drug management: I ordered medication including nitroglycerin  for chest pain   Critical Interventions:   admission for further evaluation, nitroglycerin  for chest pain, Lokelma  for  hyperkalemia  Consultations Obtained: I requested consultation with the admitting physician Dr. Charlton, and discussed  findings as well as pertinent plan - they recommend: Will admit  Reevaluation: After the interventions noted above, I reevaluated the patient and found that they have :stayed the same  Complexity of problems addressed: Patient's presentation is most consistent with  acute presentation with potential threat to life or bodily function  Disposition: After consideration of the diagnostic results and the patient's response to treatment,  I feel that the patent would benefit from admission  .        Final diagnoses:  Unstable angina (HCC)  Acute anemia  Hyperkalemia  Thyroid  mass    ED Discharge Orders  None          Midge Golas, MD 08/27/24 [phone]

## 2024-08-27 NOTE — Progress Notes (Signed)
 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  Patient has been having on and off chest pain for a while, was recently admitted at [facility], was offered CABG versus high risk PCI but she declined due to increasing her risk of progressing to dialysis.  She was admitted here just a week ago for chest pain, admitted by cardiology and discharged with medical management.  She is coming back because she has recurrent chest pain, more severe than last time.  She was found to be anemic with a hemoglobin in the 6 range and was admitted to the hospital.  Patient firmly denies any bleeding in her stool, any dark tarry stools.  She tells me she is watching this carefully.  She apparently has a history of PUD about a year ago, underwent an endoscopy at the hospital close to her home which showed an ulcer.  She was placed on PPI, and underwent repeat EGD earlier this year which showed the ulcer was healed.  She recently moved in the area in September to be with her son, and she tells me she stopped her PPI as she ran out.  Principal problem Symptomatic anemia -hemoglobin is down in the 6 range from 7.71-week ago.  She was ordered 2 units of packed red blood cells, will monitor response and recheck hemoglobin in the morning.  She denies any melena and has no frank bleeding - This is probably in the setting of advanced renal disease   Active problems CAD-fairly advanced apparently, new offered her CABG versus high risk PCI per prior notes, and she refused due to risk of progressing to ESRD  Acute kidney injury on CKD stage V -creatinine on admission 6.7 from 5.5 a week ago.  Repeat creatinine however this morning is 5.6 which  is actually close to her baseline.  Continue sodium bicarb  Hyperkalemia-was given Lokelma , repeat potassium later this afternoon  History of PUD-she tells me she is watching very carefully stools, there is no evidence of melena, no bleeding.  Start PPI again as she has been off of it for 3 months.  Chronic diastolic CHF-EF was normal on TEE from July 2025.  She has no evidence of fluid overload  DM 2-continue sliding scale  COPD, chronic hypoxemia-no wheezing, continue home medications  Thyroid  mass-noted incidentally, thyroid  ultrasound showed goiter, few thyroid  nodules which are probably need to be biopsied as an outpatient.  They are felt to be mildly suspicious.  TSH and free T4 unremarkable    Scheduled Meds:  sodium chloride    Intravenous Once   aspirin  EC  81 mg Oral Daily   atorvastatin   80 mg Oral Daily   budesonide -glycopyrrolate -formoterol   2 puff Inhalation BID   carvedilol   25 mg Oral BID WC   clopidogrel   75 mg Oral Daily   heparin   5,000 Units Subcutaneous Q8H   insulin  aspart  0-5 Units Subcutaneous QHS   insulin  aspart  0-6 Units Subcutaneous TID WC   pantoprazole   40 mg Oral BID AC   sodium bicarbonate   650 mg Oral BID   sodium chloride  flush  3 mL Intravenous Q12H   Continuous Infusions: PRN Meds:.acetaminophen  **OR** acetaminophen , fentaNYL  (SUBLIMAZE ) injection, ondansetron  **OR** ondansetron  (ZOFRAN ) IV, senna  Sabrina Hanson M. Trixie,  MD, PhD [facility]  Between 7 am - 7 pm you can contact me via Amion (for emergencies) or Securechat (non urgent matters).  I am not available 7 pm - 7 am, please contact night coverage MD/APP via Amion

## 2024-08-27 NOTE — ED Notes (Signed)
 Pt ambulated to restroom with assistance. Had chest pain upon returning to the room.

## 2024-08-27 NOTE — ED Triage Notes (Signed)
 Pt BIB EMS from home for c/o CP intermittent during the day. 8/10. Nitroglycerin  x1 with minimal relief. Was seen recently and given nitroglycerin . Pt received 3 nitroglycerin  with EMS, pain went from 8/10 to 3/10. Pain is right sided throbbing radiating to back. 138/62 hr 70 spo2 100 2lnc. Pt on 2lnc at baseline.

## 2024-08-28 ENCOUNTER — Inpatient Hospital Stay (HOSPITAL_COMMUNITY)

## 2024-08-28 DIAGNOSIS — I5031 Acute diastolic (congestive) heart failure: Secondary | ICD-10-CM | POA: Diagnosis not present

## 2024-08-28 LAB — BASIC METABOLIC PANEL WITH GFR
Anion gap: 6 (ref 5–15)
BUN: 76 mg/dL — ABNORMAL HIGH (ref 8–23)
CO2: 19 mmol/L — ABNORMAL LOW (ref 22–32)
Calcium: 7.8 mg/dL — ABNORMAL LOW (ref 8.9–10.3)
Chloride: 114 mmol/L — ABNORMAL HIGH (ref 98–111)
Creatinine, Ser: 6.74 mg/dL — ABNORMAL HIGH (ref 0.44–1.00)
GFR, Estimated: 6 mL/min — ABNORMAL LOW (ref >60)
Glucose, Bld: 90 mg/dL (ref 70–99)
Potassium: 6.1 mmol/L — ABNORMAL HIGH (ref 3.5–5.1)
Sodium: 139 mmol/L (ref 135–145)

## 2024-08-28 LAB — IRON AND TIBC
Iron: 71 ug/dL (ref 28–170)
Saturation Ratios: 32 % — ABNORMAL HIGH (ref 10.4–31.8)
TIBC: 225 ug/dL — ABNORMAL LOW (ref 250–450)
UIBC: 154 ug/dL

## 2024-08-28 LAB — ECHOCARDIOGRAM COMPLETE
AR max vel: 2.43 cm2
AV Area VTI: 2.31 cm2
AV Area mean vel: 2.3 cm2
AV Mean grad: 7 mmHg
AV Peak grad: 12 mmHg
Ao pk vel: 1.73 m/s
Area-P 1/2: 3.12 cm2
Calc EF: 65.1 %
Height: 62 in
MV M vel: 4 m/s
MV Peak grad: 63.8 mmHg
MV VTI: 1.86 cm2
S' Lateral: 3.2 cm
Single Plane A2C EF: 63.7 %
Single Plane A4C EF: 65.8 %
Weight: 2557.34 [oz_av]

## 2024-08-28 LAB — FOLATE: Folate: 10.8 ng/mL (ref >5.9)

## 2024-08-28 LAB — GLUCOSE, CAPILLARY
Glucose-Capillary: 145 mg/dL — ABNORMAL HIGH (ref 70–99)
Glucose-Capillary: 105 mg/dL — ABNORMAL HIGH (ref 70–99)
Glucose-Capillary: 96 mg/dL (ref 70–99)
Glucose-Capillary: 121 mg/dL — ABNORMAL HIGH (ref 70–99)

## 2024-08-28 LAB — CBC
HCT: 31.7 % — ABNORMAL LOW (ref 36.0–46.0)
Hemoglobin: 9.8 g/dL — ABNORMAL LOW (ref 12.0–15.0)
MCH: 27.6 pg (ref 26.0–34.0)
MCHC: 30.9 g/dL (ref 30.0–36.0)
MCV: 89.3 fL (ref 80.0–100.0)
Platelets: 166 K/uL (ref 150–400)
RBC: 3.55 MIL/uL — ABNORMAL LOW (ref 3.87–5.11)
RDW: 15.7 % — ABNORMAL HIGH (ref 11.5–15.5)
WBC: 5.9 K/uL (ref 4.0–10.5)
nRBC: 0 % (ref 0.0–0.2)

## 2024-08-28 LAB — BPAM RBC
Blood Product Expiration Date: 202601012359
Blood Product Expiration Date: 202601032359
ISSUE DATE / TIME: 202512090430
ISSUE DATE / TIME: 202512091111
Unit Type and Rh: 5100
Unit Type and Rh: 5100

## 2024-08-28 LAB — UREA NITROGEN, URINE: Urea Nitrogen, Ur: 214 mg/dL

## 2024-08-28 LAB — TYPE AND SCREEN
ABO/RH(D): O POS
Antibody Screen: NEGATIVE
Unit division: 0
Unit division: 0

## 2024-08-28 LAB — POTASSIUM: Potassium: 4.6 mmol/L (ref 3.5–5.1)

## 2024-08-28 LAB — RETICULOCYTES
Immature Retic Fract: 4.8 % (ref 2.3–15.9)
RBC.: 3.6 MIL/uL — ABNORMAL LOW (ref 3.87–5.11)
Retic Count, Absolute: 51.5 K/uL (ref 19.0–186.0)
Retic Ct Pct: 1.4 % (ref 0.4–3.1)

## 2024-08-28 LAB — VITAMIN B12: Vitamin B-12: 363 pg/mL (ref 180–914)

## 2024-08-28 LAB — T3, FREE: T3, Free: 1.9 pg/mL — ABNORMAL LOW (ref 2.0–4.4)

## 2024-08-28 LAB — FERRITIN: Ferritin: 250 ng/mL (ref 11–307)

## 2024-08-28 MED ORDER — INSULIN ASPART 100 UNIT/ML IJ SOLN
5.0000 [IU] | Freq: Once | INTRAMUSCULAR | Status: AC
  Administered 2024-08-28: 5 [IU] via INTRAVENOUS
  Filled 2024-08-28: qty 5

## 2024-08-28 MED ORDER — SODIUM BICARBONATE 8.4 % IV SOLN
25.0000 meq | Freq: Once | INTRAVENOUS | Status: AC
  Administered 2024-08-28: 25 meq via INTRAVENOUS
  Filled 2024-08-28: qty 50

## 2024-08-28 MED ORDER — GUAIFENESIN-DM 100-10 MG/5ML PO SYRP
5.0000 mL | ORAL_SOLUTION | Freq: Four times a day (QID) | ORAL | Status: AC | PRN
  Administered 2024-08-28: 5 mL via ORAL
  Filled 2024-08-28: qty 5

## 2024-08-28 MED ORDER — SODIUM ZIRCONIUM CYCLOSILICATE 10 G PO PACK
10.0000 g | PACK | Freq: Once | ORAL | Status: AC
  Administered 2024-08-28: 10 g via ORAL
  Filled 2024-08-28: qty 1

## 2024-08-28 MED ORDER — CEFAZOLIN SODIUM-DEXTROSE 2-4 GM/100ML-% IV SOLN
2.0000 g | INTRAVENOUS | Status: AC
  Administered 2024-08-29: 2 g via INTRAVENOUS
  Filled 2024-08-28: qty 100

## 2024-08-28 MED ORDER — DEXTROSE 50 % IV SOLN
25.0000 mL | Freq: Once | INTRAVENOUS | Status: AC
  Administered 2024-08-28: 25 mL via INTRAVENOUS
  Filled 2024-08-28: qty 50

## 2024-08-28 MED ORDER — GUAIFENESIN-DM 100-10 MG/5ML PO SYRP
5.0000 mL | ORAL_SOLUTION | Freq: Once | ORAL | Status: AC | PRN
  Administered 2024-08-28: 5 mL via ORAL
  Filled 2024-08-28: qty 5

## 2024-08-28 MED ORDER — SODIUM ZIRCONIUM CYCLOSILICATE 10 G PO PACK
10.0000 g | PACK | Freq: Two times a day (BID) | ORAL | Status: DC
  Administered 2024-08-28 – 2024-08-29 (×3): 10 g via ORAL
  Filled 2024-08-28 (×3): qty 1

## 2024-08-28 NOTE — Care Plan (Signed)
 [facility] records reviewed.   After a thorough review of the chart this patient does not meet criteria for a Transitions Call at this time. Reason for Disqualification : Currrently Inpatient. Please contact [phone] with any further questions.              [place] Bryant, RN

## 2024-08-28 NOTE — TOC Initial Note (Signed)
 Transition of Care [facility]) - Initial/Assessment Note    Patient Details  Name: Sabrina Hanson MRN: [id number] Date of Birth: Dec 11, 1945  Transition of Care [facility] CM/SW Contact:    Waddell Barnie Rama, RN Phone Number: 08/28/2024, 12:01 PM  Clinical Narrative:                 From home with son, has PCP and insurance on file, states has no HH services in place at this time , but has had HH services with [facility] in the past and she really like them, has home oxygen with [employer] 2 liters, w/chair, and a walker at home.  States family member  (son) will transport them home at [employer] and family is support system, states [facility] [address].  Pta self ambulatory with walker.   She is on bedrest at this time , may need some HH services after Physical therapy sees her.    Expected Discharge Plan: Home w Home Health Services Barriers to Discharge: Continued Medical Work up   Patient Goals and CMS Choice Patient states their goals for this hospitalization and ongoing recovery are:: return home with son   Choice offered to / list presented to : NA      Expected Discharge Plan and Services In-house Referral: NA Discharge Planning Services: CM Consult Post Acute Care Choice: NA Living arrangements for the past 2 months: Single Family Home                 DME Arranged: N/A DME Agency: NA       HH Arranged: NA          Prior Living Arrangements/Services Living arrangements for the past 2 months: Single Family Home Lives with:: Adult Children Patient language and need for interpreter reviewed:: Yes Do you feel safe going back to the place where you live?: Yes      Need for Family Participation in Patient Care: Yes (Comment) Care giver support system in place?: Yes (comment) Current home services: DME (w/chair, walker, home oxygen 2 liters with [employer]) Criminal Activity/Legal Involvement Pertinent to Current Situation/Hospitalization: No - Comment  as needed  Activities of Daily Living   ADL Screening (condition at time of admission) Independently performs ADLs?: Yes (appropriate for developmental age) Is the patient deaf or have difficulty hearing?: No Does the patient have difficulty seeing, even when wearing glasses/contacts?: No Does the patient have difficulty concentrating, remembering, or making decisions?: No  Permission Sought/Granted Permission sought to share information with : Case Manager Permission granted to share information with : Yes, Verbal Permission Granted     Permission granted to share info w AGENCY: HH, DME        Emotional Assessment Appearance:: Appears stated age Attitude/Demeanor/Rapport: Engaged Affect (typically observed): Appropriate Orientation: : Oriented to Self, Oriented to Place, Oriented to  Time, Oriented to Situation Alcohol / Substance Use: Not Applicable Psych Involvement: No (comment)  Admission diagnosis:  Unstable angina (HCC) [I20.0] Hyperkalemia [E87.5] Thyroid  mass [E07.9] Symptomatic anemia [D64.9] Acute anemia [D64.9] Patient Active Problem List   Diagnosis Date Noted   Symptomatic anemia 08/27/2024   Hyperkalemia 08/27/2024   Unstable angina (HCC) 08/27/2024   Acute renal failure superimposed on stage 5 chronic kidney disease, not on chronic dialysis (HCC) 08/27/2024   Thyroid  mass 08/27/2024   NSTEMI (non-ST elevated myocardial infarction) (HCC) 08/19/2024   CKD (chronic kidney disease) stage 5, GFR less than 15 ml/min (HCC) 08/19/2024   Chronic heart failure with  preserved ejection fraction (HFpEF) (HCC) 08/19/2024   Normocytic anemia 08/19/2024   Metabolic acidosis 08/19/2024   Chronic respiratory failure with hypoxia (HCC) 08/19/2024   Bilateral pneumonia 09/06/2021   SOB (shortness of breath) 09/06/2021   COPD (chronic obstructive pulmonary disease) (HCC) 09/06/2021   Allergic rhinitis    Hypertension    HLD (hyperlipidemia)    Foot drop, right 11/01/2015    Diabetes mellitus without complication (HCC) 11/01/2015   Neuropathy 11/01/2015   PCP:  [name], [name], MD Pharmacy:   [facility] [place], [place] - [address] [place] [address] Phone: [phone] Fax: [phone]  [id number] [place], [place] - [address] AT [facility] [address]  [place] [address] Phone: [phone] Fax: [phone]  [id number] [place], [place] - [address] [place] [address] Phone: [phone] Fax: [phone]  [facility] [address] [place] [address] Phone: [phone] Fax: [phone]     Social Drivers of Health (SDOH) Social History: SDOH Screenings   Food Insecurity: No Food Insecurity (08/27/2024)  Housing: Low Risk  (08/27/2024)  Transportation Needs: No Transportation Needs (08/27/2024)  Utilities: Not At Risk (08/27/2024)  Financial Resource Strain: Low Risk  (05/28/2024)   Received from [facility]  Physical Activity: Insufficiently Active (11/15/2023)   Received from [facility]  Social Connections: Moderately Integrated (08/27/2024)  Stress: No Stress Concern Present (11/15/2023)   Received from [facility]  Tobacco Use: High Risk (08/27/2024)  Health Literacy: Low Risk (11/15/2023)   Received from [facility]   SDOH Interventions:     Readmission Risk Interventions    08/28/2024   11:57 AM  Readmission Risk Prevention Plan  Transportation Screening Complete  PCP or Specialist Appt within 3-5 Days Complete  HRI or Home Care Consult Complete  Palliative Care Screening Not Applicable  Medication Review (RN Care Manager) Complete

## 2024-08-28 NOTE — Progress Notes (Signed)
 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 [facility] course notable for AKI on CKD 5, hyperkalemia  Subjective: -Breathing a little better, feels better after blood transfusion  Assessment and Plan:  Acute on chronic anemia - Secondary to CKD, no history of overt bleeding noted - Transfused 2 units PRBC yesterday, unfortunately no recent anemia panel - Posttransfusion now will check regardless  AKI on CKD 5, hyperkalemia - Baseline creatinine around 5.5, potassium remains consistently elevated - Allegedly still making urine, followed by nephrology in [place] - Agreeable to start dialysis as needed, nephrology consult requested - Given calcium  gluconate, insulin  dextrose  and Lokelma  this morning - Repeat BMP this evening  Multivessel CAD - Per prior notes has been offered CABG versus high risk PCI at [facility] earlier this year which she declined due to fear of progressive CKD - Will check 2D echo to re-evaluate EF - Once dialysis is pursued will need ischemic eval - In the meantime continue aspirin , Plavix , Coreg  and statin  History of PUD - Over a year ago, continue PPI  Chronic diastolic CHF - Preserved EF based on echo in 7/25 will repeat - Clinically appears slightly volume overloaded  COPD, chronic hypoxia - Stable, continue home nebs  Type 2 diabetes mellitus - CBG 70-1 40, continue SSI for now  Thyroid   mass - Noted incidentally, ultrasound notable for goiter - TSH and free T4 unremarkable, needs biopsy and follow-up as outpatient   DVT prophylaxis: Add heparin  subcutaneous Code Status: Full code Family Communication: None present Disposition Plan: Home pending above workup  Consultants:  Nephrology Dr. Gearline  Procedures:   Antimicrobials:    Objective: Vitals:   08/27/24 1955 08/27/24 2343 08/28/24 0354 08/28/24 0807  BP: 135/62 (!) 143/63 (!) 156/65 (!) 135/53  Pulse: 65 65 65 65  Resp: 15 20 (!) 21 19  Temp: 97.7 F (36.5 C) (!) 97.4 F (36.3 C) 98.6 F (37 C) 97.9 F (36.6 C)  TempSrc: Oral Oral Oral Oral  SpO2: 100% 100% 100% 100%  Weight:   72.5 kg   Height:        Intake/Output Summary (Last 24 hours) at 08/28/2024 1047 Last data filed at 08/28/2024 1015 Gross per 24 hour  Intake 1095 ml  Output 100 ml  Net 995 ml   Filed Weights   08/27/24 0157 08/27/24 1856 08/28/24 0354  Weight: 68.9 kg 72.8 kg 72.5 kg    Examination:  General exam: Appears calm and comfortable  ENT: Positive JVD Respiratory system: Creased at the bases Cardiovascular system: S1 & S2 heard, RRR.  Abd: nondistended, soft and nontender.Normal bowel sounds heard. Central nervous system: Alert and oriented. No focal neurological deficits. Extremities: no edema Skin: No rashes Psychiatry:  Mood & affect appropriate.     Data Reviewed:   CBC: Recent Labs  Lab 08/27/24 0204 08/27/24 0355 08/27/24 1748 08/28/24 0241  WBC 5.3 4.7 5.5 5.9  HGB 6.9* 5.9* 9.9* 9.8*  HCT 23.5* 20.1*  32.6* 31.7*  MCV 90.7 91.8 88.8 89.3  PLT 184 165 160 166   Basic Metabolic Panel: Recent Labs  Lab 08/27/24 0204 08/27/24 0355 08/27/24 1748 08/28/24 0241 08/28/24 0831  NA 136 141 139 139  --   K 5.8* 5.5* 5.4* 6.1* 4.6  CL 112* 120* 113* 114*  --   CO2 15* 10* 18* 19*  --   GLUCOSE 201* 109* 95 90  --   BUN 78* 68* 75* 76*  --   CREATININE 6.71* 5.68* 6.52* 6.74*  --   CALCIUM   7.5* 6.7* 7.9* 7.8*  --    GFR: Estimated Creatinine Clearance: 6.6 mL/min (A) (by C-G formula based on SCr of 6.74 mg/dL (H)). Liver Function Tests: No results for input(s): AST, ALT, ALKPHOS, BILITOT, PROT, ALBUMIN  in the last 168 hours. No results for input(s): LIPASE, AMYLASE in the last 168 hours. No results for input(s): AMMONIA in the last 168 hours. Coagulation Profile: No results for input(s): INR, PROTIME in the last 168 hours. Cardiac Enzymes: No results for input(s): CKTOTAL, CKMB, CKMBINDEX, TROPONINI in the last 168 hours. BNP (last 3 results) No results for input(s): PROBNP in the last 8760 hours. HbA1C: No results for input(s): HGBA1C in the last 72 hours. CBG: Recent Labs  Lab 08/27/24 0721 08/27/24 1252 08/27/24 1644 08/27/24 2107 08/28/24 0556  GLUCAP 78 93 97 122* 145*   Lipid Profile: No results for input(s): CHOL, HDL, LDLCALC, TRIG, CHOLHDL, LDLDIRECT in the last 72 hours. Thyroid  Function Tests: Recent Labs    08/27/24 0746  TSH 0.657  FREET4 0.63  T3FREE 1.9*   Anemia Panel: No results for input(s): VITAMINB12, FOLATE, FERRITIN, TIBC, IRON, RETICCTPCT in the last 72 hours. Urine analysis:    Component Value Date/Time   COLORURINE COLORLESS (A) 08/27/2024 0349   APPEARANCEUR CLEAR 08/27/2024 0349   LABSPEC 1.008 08/27/2024 0349   PHURINE 5.0 08/27/2024 0349   GLUCOSEU 50 (A) 08/27/2024 0349   HGBUR SMALL (A) 08/27/2024 0349   BILIRUBINUR NEGATIVE 08/27/2024 0349   KETONESUR NEGATIVE 08/27/2024 0349   PROTEINUR 100 (A) 08/27/2024 0349   NITRITE NEGATIVE 08/27/2024 0349   LEUKOCYTESUR NEGATIVE 08/27/2024 0349   Sepsis Labs: @LABRCNTIP (procalcitonin:4,lacticidven:4)  )No results found for this or any previous visit (from the past 240 hours).   Radiology Studies: US  THYROID  Result Date: 08/27/2024 CLINICAL DATA:  Incidental on other study. [id number] Thyroid  mass of unclear etiology  [id number] EXAM: THYROID  ULTRASOUND TECHNIQUE: Ultrasound examination of the thyroid  gland and adjacent soft tissues was performed. COMPARISON:  Chest XR, 08/27/2024. FINDINGS: Parenchymal Echotexture: Moderately heterogenous Isthmus: 1.1 cm Right lobe: 8.5 x 1.6 x 4.8 cm Left lobe: 7.5 x 3.5 x 4.1 cm _________________________________________________________ Estimated total number of nodules >/= 1 cm: 2 Number of spongiform nodules >/=  2 cm not described below (TR1): 0 Number of mixed cystic and solid nodules >/= 1.5 cm not described below (TR2): 0 _________________________________________________________ Nodule # 1: Location: RIGHT; Mid Maximum size: 4.0 cm; Other 2 dimensions: 4.0 x 3.2 cm Composition: solid/almost completely solid (2) Echogenicity: isoechoic (1) Shape: not taller-than-wide (0) Margins: ill-defined (0) Echogenic foci: none (0) ACR TI-RADS total points: 3. ACR TI-RADS risk category: TR3 (3 points). ACR TI-RADS recommendations: **Given size (>/= 2.5 cm) and appearance, fine needle aspiration of this mildly suspicious nodule should be considered based on TI-RADS criteria. _________________________________________________________ Nodule # 2: Location: LEFT; Mid Maximum size: 6.2 cm; Other 2 dimensions: 2.8 x 3.2 cm Composition: solid/almost completely solid (2) Echogenicity: isoechoic (1) Shape:  not taller-than-wide (0) Margins: ill-defined (0) Echogenic foci: none (0) ACR TI-RADS total points: 3. ACR TI-RADS risk category: TR3 (3 points). ACR TI-RADS recommendations: **Given size (>/= 2.5 cm) and appearance, fine needle aspiration of this mildly suspicious nodule should be considered based on TI-RADS criteria. _________________________________________________________ Sub-1 cm cystic nodule imaged within the LEFT gland does not meet threshold for follow-up nor biopsy per current criteria. No cervical adenopathy or abnormal fluid collection within the imaged neck. IMPRESSION: 1. Enlarged, heterogeneous  thyroid  gland consistent with a goiter. 2. Dominant, 4.0 cm RIGHT mid and 6.2 cm LEFT mid TR-3 thyroid  nodules. Non-emergent, outpatient FNA biopsies of these mildly suspicious nodules should be considered based on TI-RADS criteria. The above is in keeping with the ACR TI-RADS recommendations - J Am Coll Radiol 2017;14:587-595. Electronically Signed   By: Thom Hall M.D.   On: 08/27/2024 07:25   DG Chest 2 View Result Date: 08/27/2024 EXAM: 2 VIEW(S) XRAY OF THE CHEST 08/27/2024 02:42:00 AM COMPARISON: Portable chest 08/18/2024. CLINICAL HISTORY: Chest pain. FINDINGS: LUNGS AND PLEURA: Left mid lung linear scarring or atelectasis. Mild chronic interstitial change. No evidence of focal pneumonia. No pleural effusion. No pneumothorax. HEART AND MEDIASTINUM: The aorta is tortuous and calcified. The mediastinum is stable. The heart is slightly enlarged. No vascular congestion is seen. Smooth mass effect on the right side of the trachea at the level of T1 and T2, most typically this would be due to a thyroid  mass. A nonemergent thyroid  ultrasound is recommended. BONES AND SOFT TISSUES: Osteopenia and thoracic spondylosis. IMPRESSION: 1. No acute cardiopulmonary findings. 2. Smooth right paratracheal extrinsic mass effect at T1 extrahepatic T2, most consistent with a thyroid  mass. Recommend nonemergent thyroid  ultrasound for further characterization. Electronically signed by: Francis Quam MD 08/27/2024 02:51 AM EST RP Workstation: [id number]     Scheduled Meds:  sodium chloride    Intravenous Once   aspirin  EC  81 mg Oral Daily   atorvastatin   80 mg Oral Daily   budesonide -glycopyrrolate -formoterol   2 puff Inhalation BID   carvedilol   25 mg Oral BID WC   clopidogrel   75 mg Oral Daily   heparin   5,000 Units Subcutaneous Q8H   insulin  aspart  0-5 Units Subcutaneous QHS   insulin  aspart  0-6 Units Subcutaneous TID WC   pantoprazole   40 mg Oral BID AC   sodium bicarbonate   650 mg Oral BID   sodium  chloride flush  3 mL Intravenous Q12H   sodium zirconium cyclosilicate   10 g Oral BID   Continuous Infusions:   LOS: 1 day    Time spent:    Sigurd Pac, MD [facility]   08/28/2024, 10:47 AM

## 2024-08-28 NOTE — Consult Note (Signed)
 [facility]  HISTORY AND PHYSICAL  Sabrina Hanson is an 78 y.o. female.    Chief Complaint: chest pain  HPI: Pt is a 19F with a PMH Sig for advanced CKD, HTN, HLD DM II, COPD who is seen for CKD management.  Has advanced CKD, followed in [place].  Was doing well until this summer- has had 4 admissions since July for CP.  Hasn't been able to get a cath d/t renal dysfunction.    Cr 6.7, K 6.1-> getting temporizing agents.  + itchy.    In this setting we are asked to see. We discussed that she needs to start HD- she is not surprised and is willing to do it.    PMH: Past Medical History:  Diagnosis Date   Allergic rhinitis    Chronic heart failure with preserved ejection fraction (HFpEF) (HCC) 08/19/2024   Chronic respiratory failure with hypoxia (HCC) 08/19/2024   CKD (chronic kidney disease) stage 5, GFR less than 15 ml/min (HCC) 08/19/2024   COPD (chronic obstructive pulmonary disease) (HCC)    Diabetes mellitus without complication (HCC)    HLD (hyperlipidemia)    Hypertension    Migraine    Peripheral neuropathy    PSH: Past Surgical History:  Procedure Laterality Date   NO PAST SURGERIES      Past Medical History:  Diagnosis Date   Allergic rhinitis    Chronic heart failure with preserved ejection fraction (HFpEF) (HCC) 08/19/2024   Chronic respiratory failure with hypoxia (HCC) 08/19/2024   CKD (chronic kidney disease) stage 5, GFR less than 15 ml/min (HCC) 08/19/2024   COPD (chronic obstructive pulmonary disease) (HCC)    Diabetes mellitus without complication (HCC)    HLD (hyperlipidemia)    Hypertension    Migraine    Peripheral neuropathy     Medications:  Scheduled:  sodium chloride    Intravenous Once   aspirin  EC  81 mg Oral Daily   atorvastatin   80 mg Oral Daily   budesonide -glycopyrrolate -formoterol   2 puff Inhalation BID   carvedilol   25 mg Oral BID WC   clopidogrel   75 mg Oral Daily   heparin   5,000 Units Subcutaneous Q8H   insulin   aspart  0-5 Units Subcutaneous QHS   insulin  aspart  0-6 Units Subcutaneous TID WC   pantoprazole   40 mg Oral BID AC   sodium bicarbonate   650 mg Oral BID   sodium chloride  flush  3 mL Intravenous Q12H   sodium zirconium cyclosilicate   10 g Oral BID    Medications Prior to Admission  Medication Sig Dispense Refill   aspirin  EC 81 MG tablet Take 1 tablet (81 mg total) by mouth daily.     atorvastatin  (LIPITOR ) 80 MG tablet Take 1 tablet (80 mg total) by mouth daily. 90 tablet 0   carvedilol  (COREG ) 25 MG tablet Take 1 tablet (25 mg total) by mouth 2 (two) times daily with a meal. 180 tablet 0   clopidogrel  (PLAVIX ) 75 MG tablet Take 1 tablet (75 mg total) by mouth daily. 90 tablet 0   docusate sodium (COLACE) 100 MG capsule Take 100 mg by mouth at bedtime.     fluticasone (FLONASE) 50 MCG/ACT nasal spray Place 1 spray into both nostrils daily as needed for allergies or rhinitis.     Fluticasone-Umeclidin-Vilant (TRELEGY ELLIPTA) 100-62.5-25 MCG/ACT AEPB Inhale 1 puff into the lungs daily at 6 (six) AM.     furosemide  (LASIX ) 20 MG tablet Take 3 tablets (60 mg total) by mouth 2 (  two) times daily. 540 tablet 0   isosorbide  mononitrate (IMDUR ) 30 MG 24 hr tablet Take 1 tablet (30 mg total) by mouth daily. 90 tablet 0   nitroGLYCERIN  (NITROSTAT ) 0.4 MG SL tablet Place 1 tablet (0.4 mg total) under the tongue every 5 (five) minutes as needed for chest pain. 25 tablet 3   sodium bicarbonate  650 MG tablet Take 650 mg by mouth 2 (two) times daily.      ALLERGIES:   Allergies  Allergen Reactions   Other Other (See Comments)    PATIENT IS EXTREMELY SENSITIVE TO ALL PAIN MEDICATIONS PER FAMILY MEMBERS WHO MANAGE MEDS AT HOME.    Oxycodone Other (See Comments)    Drowsiness, shaking    Pregabalin Anxiety and Other (See Comments)    Altered mental status tremors     FAM HX: Family History  Problem Relation Age of Onset   Neurofibromatosis Neg Hx     Social History:   reports that she  has been smoking. She has never used smokeless tobacco. She reports that she does not drink alcohol and does not use drugs.  ROS: ROS: all other systems reviewed and are negative except as per HPI  Blood pressure (!) 135/53, pulse 65, temperature 97.9 F (36.6 C), temperature source Oral, resp. rate 19, height 5' 2 (1.575 m), weight 72.5 kg, SpO2 100%. PHYSICAL EXAM: Physical Exam GEN NAD HEENT eomi perrl NECK NO jvd PULM clear CV RRR ABD soft EXT no LE edema NEURO AAO x 3   Results for orders placed or performed during the hospital encounter of 08/27/24 (from the past 48 hours)  Basic metabolic panel     Status: Abnormal   Collection Time: 08/27/24  2:04 AM  Result Value Ref Range   Sodium 136 135 - 145 mmol/L   Potassium 5.8 (H) 3.5 - 5.1 mmol/L   Chloride 112 (H) 98 - 111 mmol/L   CO2 15 (L) 22 - 32 mmol/L   Glucose, Bld 201 (H) 70 - 99 mg/dL    Comment: Glucose reference range applies only to samples taken after fasting for at least 8 hours.   BUN 78 (H) 8 - 23 mg/dL   Creatinine, Ser 3.28 (H) 0.44 - 1.00 mg/dL   Calcium  7.5 (L) 8.9 - 10.3 mg/dL   GFR, Estimated 6 (L) >60 mL/min    Comment: (NOTE) Calculated using the CKD-EPI Creatinine Equation (2021)    Anion gap 9 5 - 15    Comment: Performed at [facility], [address], [place], [place] [address]  CBC     Status: Abnormal   Collection Time: 08/27/24  2:04 AM  Result Value Ref Range   WBC 5.3 4.0 - 10.5 K/uL   RBC 2.59 (L) 3.87 - 5.11 MIL/uL   Hemoglobin 6.9 (LL) 12.0 - 15.0 g/dL    Comment: REPEATED TO VERIFY This critical result has been called to S. Clydell, RN by Miguel Reveal on 08/27/2024 02:24:02, and has been read back.    HCT 23.5 (L) 36.0 - 46.0 %   MCV 90.7 80.0 - 100.0 fL   MCH 26.6 26.0 - 34.0 pg   MCHC 29.4 (L) 30.0 - 36.0 g/dL   RDW 83.8 (H) 88.4 - 84.4 %   Platelets 184 150 - 400 K/uL   nRBC 0.0 0.0 - 0.2 %    Comment: Performed at [facility], [address]  [place], [place] [address]  Troponin I (High Sensitivity)  Status: None   Collection Time: 08/27/24  2:04 AM  Result Value Ref Range   Troponin I (High Sensitivity) 9 <18 ng/L    Comment: (NOTE) Elevated high sensitivity troponin I (hsTnI) values and significant  changes across serial measurements may suggest ACS but many other  chronic and acute conditions are known to elevate hsTnI results.  Refer to the Links section for chest pain algorithms and additional  guidance. Performed at [facility], [address] [place], [place] [address]   ABO/Rh     Status: None   Collection Time: 08/27/24  2:04 AM  Result Value Ref Range   ABO/RH(D)      O POS Performed at [facility], [address], [place], [place] [address]   Type and screen [facility]     Status: None (Preliminary result)   Collection Time: 08/27/24  2:30 AM  Result Value Ref Range   ABO/RH(D) O POS    Antibody Screen NEG    Sample Expiration 08/30/2024,2359    Unit Number [id number]    Blood Component Type RED CELLS,LR    Unit division 00    Status of Unit ISSUED    Transfusion Status OK TO TRANSFUSE    Crossmatch Result Compatible    Unit Number [id number]    Blood Component Type RED CELLS,LR    Unit division 00    Status of Unit ISSUED    Transfusion Status OK TO TRANSFUSE    Crossmatch Result      Compatible Performed at [facility], [address] [place], [place] [address]   Prepare RBC (crossmatch)     Status: None   Collection Time: 08/27/24  3:35 AM  Result Value Ref Range   Order Confirmation      ORDER PROCESSED BY BLOOD BANK Performed at [facility], [address] [place], [place] [address]   Urea nitrogen, urine     Status: None   Collection Time: 08/27/24  3:45 AM  Result Value Ref Range   Urea Nitrogen, Ur 214 Not Estab. mg/dL    Comment: (NOTE) Performed At: [facility] [address] [place], [place] [id number] Jennette Shorter MD Ey:[id number]   Urinalysis, Complete w Microscopic -Urine, Clean Catch     Status: Abnormal   Collection Time: 08/27/24  3:49 AM  Result Value Ref Range   Color, Urine COLORLESS (A) YELLOW   APPearance CLEAR CLEAR   Specific Gravity, Urine 1.008 1.005 - 1.030   pH 5.0 5.0 - 8.0   Glucose, UA 50 (A) NEGATIVE mg/dL   Hgb urine dipstick SMALL (A) NEGATIVE   Bilirubin Urine NEGATIVE NEGATIVE   Ketones, ur NEGATIVE NEGATIVE mg/dL   Protein, ur 899 (A) NEGATIVE mg/dL   Nitrite NEGATIVE NEGATIVE   Leukocytes,Ua NEGATIVE NEGATIVE   RBC / HPF 0-5 0 - 5 RBC/hpf   WBC, UA 0-5 0 - 5 WBC/hpf   Bacteria, UA NONE SEEN NONE SEEN   Squamous Epithelial / HPF 0-5 0 - 5 /HPF   Mucus PRESENT     Comment: Performed at [facility], [address], [place], [place] [address]  Sodium, urine, random     Status: None   Collection Time: 08/27/24  3:49 AM  Result Value Ref Range   Sodium, Ur 105 mmol/L    Comment: Performed at [facility], [address] [place], [place] [address]  Creatinine, urine, random  Status: None   Collection Time: 08/27/24  3:49 AM  Result Value Ref Range   Creatinine, Urine 45 mg/dL    Comment: Performed at [facility], [address] [place], [place] [address]  Troponin I (High Sensitivity)     Status: None   Collection Time: 08/27/24  3:55 AM  Result Value Ref Range   Troponin I (High Sensitivity) 8 <18 ng/L    Comment: (NOTE) Elevated high sensitivity troponin I (hsTnI) values and significant  changes across serial measurements may suggest ACS but many other  chronic and acute conditions are known to elevate hsTnI results.  Refer to the Links section for chest pain algorithms and additional  guidance. Performed at [facility], [address], [place], [place] [address]   Basic metabolic panel     Status: Abnormal   Collection Time: 08/27/24  3:55 AM  Result Value Ref Range   Sodium 141 135 - 145 mmol/L   Potassium 5.5 (H)  3.5 - 5.1 mmol/L    Comment: HEMOLYSIS AT THIS LEVEL MAY AFFECT RESULT   Chloride 120 (H) 98 - 111 mmol/L   CO2 10 (L) 22 - 32 mmol/L   Glucose, Bld 109 (H) 70 - 99 mg/dL    Comment: Glucose reference range applies only to samples taken after fasting for at least 8 hours.   BUN 68 (H) 8 - 23 mg/dL   Creatinine, Ser 4.31 (H) 0.44 - 1.00 mg/dL   Calcium  6.7 (L) 8.9 - 10.3 mg/dL   GFR, Estimated 7 (L) >60 mL/min    Comment: (NOTE) Calculated using the CKD-EPI Creatinine Equation (2021)    Anion gap 11 5 - 15    Comment: Performed at [facility], [address] [place], [place] [address]  CBC     Status: Abnormal   Collection Time: 08/27/24  3:55 AM  Result Value Ref Range   WBC 4.7 4.0 - 10.5 K/uL   RBC 2.19 (L) 3.87 - 5.11 MIL/uL   Hemoglobin 5.9 (LL) 12.0 - 15.0 g/dL    Comment: CRITICAL VALUE NOTED.  VALUE IS CONSISTENT WITH PREVIOUSLY REPORTED AND CALLED VALUE. REPEATED TO VERIFY    HCT 20.1 (L) 36.0 - 46.0 %   MCV 91.8 80.0 - 100.0 fL   MCH 26.9 26.0 - 34.0 pg   MCHC 29.4 (L) 30.0 - 36.0 g/dL   RDW 83.5 (H) 88.4 - 84.4 %   Platelets 165 150 - 400 K/uL   nRBC 0.0 0.0 - 0.2 %    Comment: Performed at [facility], [address] [place], [place] [address]  CBG monitoring, ED     Status: None   Collection Time: 08/27/24  7:21 AM  Result Value Ref Range   Glucose-Capillary 78 70 - 99 mg/dL    Comment: Glucose reference range applies only to samples taken after fasting for at least 8 hours.  TSH     Status: None   Collection Time: 08/27/24  7:46 AM  Result Value Ref Range   TSH 0.657 0.350 - 4.500 uIU/mL    Comment: Performed by a 3rd Generation assay with a functional sensitivity of <=0.01 uIU/mL. Performed at [facility], [address], [place], [place] [address]   T4, free     Status: None   Collection Time: 08/27/24  7:46 AM  Result Value Ref Range   Free T4 0.63 0.61 - 1.12 ng/dL    Comment: (NOTE) Biotin ingestion  may interfere with free T4  tests. If the results are inconsistent with the TSH level, previous test results, or the clinical presentation, then consider biotin interference. If needed, order repeat testing after stopping biotin. Performed at [facility], [address], [place], [place] [address]   T3, free     Status: Abnormal   Collection Time: 08/27/24  7:46 AM  Result Value Ref Range   T3, Free 1.9 (L) 2.0 - 4.4 pg/mL    Comment: (NOTE) Performed At: [facility] [address] [place], [place] [id number] Jennette Shorter MD Ey:[id number]   CBG monitoring, ED     Status: None   Collection Time: 08/27/24 12:52 PM  Result Value Ref Range   Glucose-Capillary 93 70 - 99 mg/dL    Comment: Glucose reference range applies only to samples taken after fasting for at least 8 hours.  CBG monitoring, ED     Status: None   Collection Time: 08/27/24  4:44 PM  Result Value Ref Range   Glucose-Capillary 97 70 - 99 mg/dL    Comment: Glucose reference range applies only to samples taken after fasting for at least 8 hours.  Basic metabolic panel     Status: Abnormal   Collection Time: 08/27/24  5:48 PM  Result Value Ref Range   Sodium 139 135 - 145 mmol/L   Potassium 5.4 (H) 3.5 - 5.1 mmol/L   Chloride 113 (H) 98 - 111 mmol/L   CO2 18 (L) 22 - 32 mmol/L   Glucose, Bld 95 70 - 99 mg/dL    Comment: Glucose reference range applies only to samples taken after fasting for at least 8 hours.   BUN 75 (H) 8 - 23 mg/dL   Creatinine, Ser 3.47 (H) 0.44 - 1.00 mg/dL   Calcium  7.9 (L) 8.9 - 10.3 mg/dL   GFR, Estimated 6 (L) >60 mL/min    Comment: (NOTE) Calculated using the CKD-EPI Creatinine Equation (2021)    Anion gap 8 5 - 15    Comment: Performed at [facility], [address] [place], [place] [address]  CBC     Status: Abnormal   Collection Time: 08/27/24  5:48 PM  Result Value Ref Range   WBC 5.5 4.0 - 10.5 K/uL   RBC 3.67 (L) 3.87 - 5.11 MIL/uL   Hemoglobin 9.9 (L) 12.0 - 15.0 g/dL     Comment: POST TRANSFUSION SPECIMEN REPEATED TO VERIFY    HCT 32.6 (L) 36.0 - 46.0 %   MCV 88.8 80.0 - 100.0 fL   MCH 27.0 26.0 - 34.0 pg   MCHC 30.4 30.0 - 36.0 g/dL   RDW 84.2 (H) 88.4 - 84.4 %   Platelets 160 150 - 400 K/uL   nRBC 0.0 0.0 - 0.2 %    Comment: Performed at [facility], [address] [place], [place] [address]  Glucose, capillary     Status: Abnormal   Collection Time: 08/27/24  9:07 PM  Result Value Ref Range   Glucose-Capillary 122 (H) 70 - 99 mg/dL    Comment: Glucose reference range applies only to samples taken after fasting for at least 8 hours.  Basic metabolic panel     Status: Abnormal   Collection Time: 08/28/24  2:41 AM  Result Value Ref Range   Sodium 139 135 - 145 mmol/L   Potassium 6.1 (H) 3.5 - 5.1 mmol/L   Chloride 114 (H) 98 - 111 mmol/L   CO2 19 (L) 22 -  32 mmol/L   Glucose, Bld 90 70 - 99 mg/dL    Comment: Glucose reference range applies only to samples taken after fasting for at least 8 hours.   BUN 76 (H) 8 - 23 mg/dL   Creatinine, Ser 3.25 (H) 0.44 - 1.00 mg/dL   Calcium  7.8 (L) 8.9 - 10.3 mg/dL   GFR, Estimated 6 (L) >60 mL/min    Comment: (NOTE) Calculated using the CKD-EPI Creatinine Equation (2021)    Anion gap 6 5 - 15    Comment: Performed at [facility], [address], [place], [place] [address]  CBC     Status: Abnormal   Collection Time: 08/28/24  2:41 AM  Result Value Ref Range   WBC 5.9 4.0 - 10.5 K/uL   RBC 3.55 (L) 3.87 - 5.11 MIL/uL   Hemoglobin 9.8 (L) 12.0 - 15.0 g/dL   HCT 68.2 (L) 63.9 - 53.9 %   MCV 89.3 80.0 - 100.0 fL   MCH 27.6 26.0 - 34.0 pg   MCHC 30.9 30.0 - 36.0 g/dL   RDW 84.2 (H) 88.4 - 84.4 %   Platelets 166 150 - 400 K/uL   nRBC 0.0 0.0 - 0.2 %    Comment: Performed at [facility], [address], [place], [place] [address]  Glucose, capillary     Status: Abnormal   Collection Time: 08/28/24  5:56 AM  Result Value Ref Range   Glucose-Capillary 145 (H) 70 - 99 mg/dL     Comment: Glucose reference range applies only to samples taken after fasting for at least 8 hours.  Potassium     Status: None   Collection Time: 08/28/24  8:31 AM  Result Value Ref Range   Potassium 4.6 3.5 - 5.1 mmol/L    Comment: Performed at [facility], [address] [place], [place] [address]    US  THYROID  Result Date: 08/27/2024 CLINICAL DATA:  Incidental on other study. [id number] Thyroid  mass of unclear etiology [id number] EXAM: THYROID  ULTRASOUND TECHNIQUE: Ultrasound examination of the thyroid  gland and adjacent soft tissues was performed. COMPARISON:  Chest XR, 08/27/2024. FINDINGS: Parenchymal Echotexture: Moderately heterogenous Isthmus: 1.1 cm Right lobe: 8.5 x 1.6 x 4.8 cm Left lobe: 7.5 x 3.5 x 4.1 cm _________________________________________________________ Estimated total number of nodules >/= 1 cm: 2 Number of spongiform nodules >/=  2 cm not described below (TR1): 0 Number of mixed cystic and solid nodules >/= 1.5 cm not described below (TR2): 0 _________________________________________________________ Nodule # 1: Location: RIGHT; Mid Maximum size: 4.0 cm; Other 2 dimensions: 4.0 x 3.2 cm Composition: solid/almost completely solid (2) Echogenicity: isoechoic (1) Shape: not taller-than-wide (0) Margins: ill-defined (0) Echogenic foci: none (0) ACR TI-RADS total points: 3. ACR TI-RADS risk category: TR3 (3 points). ACR TI-RADS recommendations: **Given size (>/= 2.5 cm) and appearance, fine needle aspiration of this mildly suspicious nodule should be considered based on TI-RADS criteria. _________________________________________________________ Nodule # 2: Location: LEFT; Mid Maximum size: 6.2 cm; Other 2 dimensions: 2.8 x 3.2 cm Composition: solid/almost completely solid (2) Echogenicity: isoechoic (1) Shape: not taller-than-wide (0) Margins: ill-defined (0) Echogenic foci: none (0) ACR TI-RADS total points: 3. ACR TI-RADS risk category: TR3 (3 points). ACR TI-RADS recommendations:  **Given size (>/= 2.5 cm) and appearance, fine needle aspiration of this mildly suspicious nodule should be considered based on TI-RADS criteria. _________________________________________________________ Sub-1 cm cystic nodule imaged within the LEFT gland does not meet threshold for follow-up nor biopsy per current criteria. No cervical  adenopathy or abnormal fluid collection within the imaged neck. IMPRESSION: 1. 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 2. Dominant, 4.0 cm RIGHT mid and 6.2 cm LEFT mid TR-3 thyroid  nodules. Non-emergent, outpatient FNA biopsies of these mildly suspicious nodules should be considered based on TI-RADS criteria. The above is in keeping with the ACR TI-RADS recommendations - J Am Coll Radiol 2017;14:587-595. Electronically Signed   By: Thom Hall M.D.   On: 08/27/2024 07:25   DG Chest 2 View Result Date: 08/27/2024 EXAM: 2 VIEW(S) XRAY OF THE CHEST 08/27/2024 02:42:00 AM COMPARISON: Portable chest 08/18/2024. CLINICAL HISTORY: Chest pain. FINDINGS: LUNGS AND PLEURA: Left mid lung linear scarring or atelectasis. Mild chronic interstitial change. No evidence of focal pneumonia. No pleural effusion. No pneumothorax. HEART AND MEDIASTINUM: The aorta is tortuous and calcified. The mediastinum is stable. The heart is slightly enlarged. No vascular congestion is seen. Smooth mass effect on the right side of the trachea at the level of T1 and T2, most typically this would be due to a thyroid  mass. A nonemergent thyroid  ultrasound is recommended. BONES AND SOFT TISSUES: Osteopenia and thoracic spondylosis. IMPRESSION: 1. No acute cardiopulmonary findings. 2. Smooth right paratracheal extrinsic mass effect at T1 extrahepatic T2, most consistent with a thyroid  mass. Recommend nonemergent thyroid  ultrasound for further characterization. 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  HTN:  - controlled on home meds  3.  CAD/ CP  - ultimately needs LHC likely  4.  Bones:  - will check PTH and Vit D  5.  Anemia:  Hgb 9.8, will check iron and Ferritin  6.  Dispo: admitted, will need CLIP  Lundyn Coste 08/28/2024, 10:30 AM

## 2024-08-28 NOTE — Progress Notes (Signed)
 Echocardiogram 2D Echocardiogram has been performed.  Sabrina Hanson 08/28/2024, 4:20 PM

## 2024-08-28 NOTE — Progress Notes (Addendum)
.. ° ° °  PROCEDURAL EXPEDITER PROGRESS NOTE  Patient Name: Sabrina Hanson  DOB:27-Mar-1946 Date of Admission: 08/27/2024  Date of Assessment:08/28/24   -------------------------------------------------------------------------------------------------------------------   Brief clinical summary: Pt to OR on 12/10 for tunneled dialysis catheter placement; possible left arm AV fistula creation vs AV graft placement  Orders in place:  Yes   Communication with surgical team if no orders: N/A  Labs, test, and orders reviewed: Y  Requires surgical clearance:  No  Barriers noted: N/A   Intervention provided by [facility] team: N/A  Barrier resolved:  not applicable   -------------------------------------------------------------------------------------------------------------------  [employer], [place], [place] Please contact us  directly via secure chat (search for [facility]) or by calling us  at [phone] [facility]).

## 2024-08-28 NOTE — H&P (View-Only) (Signed)
 Hospital Consult    Reason for Consult:  in need of dialysis access Requesting Physician:  Gearline MRN #:  [id number]  History of Present Illness: This is a 78 y.o. female who presented to the hospital with yesterday with chest pain while at rest. It resolved in about 30 minutes and she did receive NTG.   She has hx of MVCAD and was previously offered CABG vs high risk PCI at [facility] earlier this year, but declined due to fear of progression of her kidney disease.  She has been hospitalized several times in the past few months for chest pain.   In the ER, she was hyperkalemic with K+ of 5.8.  Her K+ this am was 6.1 and now down to 4.6. Vascular surgery is consulted for permanent dialysis access and [facility] placement.    She has been followed by vascular surgery in [place] for PAD and was last seen by them in 2020.  She does have hx of back pain with radiculopathy of BLE and has received ESI in the past.    She has hx of RF ablation in 2019.   Pt states she lives in [place], [place], which is [place] of [place].  She is here visiting her son, who went to [employer] and stayed in the area.  She states she is right hand dominant.  She has hx of PAD with sx in the right leg in the past.  She denies any non healing wounds or rest pain.  She states she has been told that CABG is no longer an option and that she would require a stent.    Pt was seen by Dr. Wendel on 08/19/2024 and per his note, her left heart cath at [facility] revealed  left main 70% stenosis, mid LAD 50%, diagonal 90% ostial, ostial left circumflex 70%, mid left circumflex 75% and occluded RCA.   The pt is on a statin for cholesterol management.  The pt is on a daily aspirin .   Other AC:  Plavix  The pt is on BB, diuretic for hypertension.   The pt is not on medication for diabetes PTA. Tobacco hx:  current  Past Medical History:  Diagnosis Date   Allergic rhinitis    Chronic heart failure with preserved ejection fraction (HFpEF) (HCC) 08/19/2024    Chronic respiratory failure with hypoxia (HCC) 08/19/2024   CKD (chronic kidney disease) stage 5, GFR less than 15 ml/min (HCC) 08/19/2024   COPD (chronic obstructive pulmonary disease) (HCC)    Diabetes mellitus without complication (HCC)    HLD (hyperlipidemia)    Hypertension    Migraine    Peripheral neuropathy     Past Surgical History:  Procedure Laterality Date   NO PAST SURGERIES      Allergies  Allergen Reactions   Other Other (See Comments)    PATIENT IS EXTREMELY SENSITIVE TO ALL PAIN MEDICATIONS PER FAMILY MEMBERS WHO MANAGE MEDS AT HOME.    Oxycodone Other (See Comments)    Drowsiness, shaking    Pregabalin Anxiety and Other (See Comments)    Altered mental status tremors     Prior to Admission medications   Medication Sig Start Date End Date Taking? Authorizing Provider  aspirin  EC 81 MG tablet Take 1 tablet (81 mg total) by mouth daily. 08/20/24   Leotis Bogus, MD  atorvastatin  (LIPITOR ) 80 MG tablet Take 1 tablet (80 mg total) by mouth daily. 08/20/24   Leotis Bogus, MD  carvedilol  (COREG ) 25 MG tablet Take 1 tablet (25 mg  total) by mouth 2 (two) times daily with a meal. 08/20/24   Leotis Bogus, MD  clopidogrel  (PLAVIX ) 75 MG tablet Take 1 tablet (75 mg total) by mouth daily. 08/20/24   Leotis Bogus, MD  docusate sodium (COLACE) 100 MG capsule Take 100 mg by mouth at bedtime.    [provider]  fluticasone (FLONASE) 50 MCG/ACT nasal spray Place 1 spray into both nostrils daily as needed for allergies or rhinitis. 03/21/24   [provider]  Fluticasone-Umeclidin-Vilant (TRELEGY ELLIPTA) 100-62.5-25 MCG/ACT AEPB Inhale 1 puff into the lungs daily at 6 (six) AM.    [provider]  furosemide  (LASIX ) 20 MG tablet Take 3 tablets (60 mg total) by mouth 2 (two) times daily. 08/20/24   Leotis Bogus, MD  isosorbide  mononitrate (IMDUR ) 30 MG 24 hr tablet Take 1 tablet (30 mg total) by mouth daily. 08/21/24   Leotis Bogus, MD   nitroGLYCERIN  (NITROSTAT ) 0.4 MG SL tablet Place 1 tablet (0.4 mg total) under the tongue every 5 (five) minutes as needed for chest pain. 08/20/24 08/20/25  Leotis Bogus, MD  sodium bicarbonate  650 MG tablet Take 650 mg by mouth 2 (two) times daily.    [provider]    Social History   Socioeconomic History   Marital status: Single    Spouse name: Not on file   Number of children: 1   Years of education: 12   Highest education level: Not on file  Occupational History   Occupation: Retired  Tobacco Use   Smoking status: Every Day   Smokeless tobacco: Never  Substance and Sexual Activity   Alcohol use: No    Alcohol/week: 0.0 standard drinks of alcohol   Drug use: No   Sexual activity: Not on file  Other Topics Concern   Not on file  Social History Narrative   Lives alone   Caffeine use: Daily    Social Drivers of Health   Financial Resource Strain: Low Risk  (05/28/2024)   Received from [facility]   Overall Financial Resource Strain (CARDIA)    Difficulty of Paying Living Expenses: Not very hard  Food Insecurity: No Food Insecurity (08/27/2024)   Hunger Vital Sign    Worried About Running Out of Food in the Last Year: Never true    Ran Out of Food in the Last Year: Never true  Transportation Needs: No Transportation Needs (08/27/2024)   PRAPARE - Administrator, Civil Service (Medical): No    Lack of Transportation (Non-Medical): No  Physical Activity: Insufficiently Active (11/15/2023)   Received from [facility]   Exercise Vital Sign    On average, how many days per week do you engage in moderate to strenuous exercise (like a brisk walk)?: 7 days    On average, how many minutes do you engage in exercise at this level?: 10 min  Stress: No Stress Concern Present (11/15/2023)   Received from [facility] - Occupational Stress Questionnaire    Feeling of Stress : Not at all  Social  Connections: Moderately Integrated (08/27/2024)   Social Connection and Isolation Panel    Frequency of Communication with Friends and Family: More than three times a week    Frequency of Social Gatherings with Friends and Family: More than three times a week    Attends Religious Services: More than 4 times per year    Active Member of [employer] or Organizations: Yes  Attends Banker Meetings: More than 4 times per year    Marital Status: Never married  Intimate Partner Violence: Not At Risk (08/27/2024)   Humiliation, Afraid, Rape, and Kick questionnaire    Fear of Current or Ex-Partner: No    Emotionally Abused: No    Physically Abused: No    Sexually Abused: No     Family History  Problem Relation Age of Onset   Neurofibromatosis Neg Hx     ROS: [x]  Positive   [ ]  Negative   [ ]  All sytems reviewed and are negative  Cardiac: [x]  chest pain/pressure []  hx MI []  SOB   Vascular: [x]  hx PAD   Pulmonary: []  asthma/wheezing []  home O2  Neurologic: []  hx of CVA []  mini stroke   Hematologic: []  hx of cancer  Endocrine:   []  diabetes []  thyroid  disease  GI []  GERD  GU: [x]  CKD/renal failure []  HD--[]  M/W/F or []  T/T/S  Psychiatric: []  anxiety []  depression  Musculoskeletal: []  arthritis []  joint pain  Integumentary: []  rashes []  ulcers  Constitutional: []  fever  []  chills  Physical Examination  Vitals:   08/28/24 0354 08/28/24 0807  BP: (!) 156/65 (!) 135/53  Pulse: 65 65  Resp: (!) 21 19  Temp: 98.6 F (37 C) 97.9 F (36.6 C)  SpO2: 100% 100%   Body mass index is 29.23 kg/m.  General:  WDWN in NAD Gait: Not observed HENT: WNL, normocephalic Pulmonary: normal non-labored breathing Cardiac: regular Skin: without rashes Vascular Exam/Pulses: 2+ palpable bilateral radial and ulnar pulses.   Pedal pulses are not palpable Extremities: IV left forearm Musculoskeletal: no muscle wasting or atrophy  Neurologic: A&O X  3 Psychiatric:  The pt has Normal affect.   CBC    Component Value Date/Time   WBC 5.9 08/28/2024 0241   RBC 3.55 (L) 08/28/2024 0241   HGB 9.8 (L) 08/28/2024 0241   HCT 31.7 (L) 08/28/2024 0241   PLT 166 08/28/2024 0241   MCV 89.3 08/28/2024 0241   MCH 27.6 08/28/2024 0241   MCHC 30.9 08/28/2024 0241   RDW 15.7 (H) 08/28/2024 0241   LYMPHSABS 0.3 (L) 09/06/2021 0400   MONOABS 0.1 09/06/2021 0400   EOSABS 0.0 09/06/2021 0400   BASOSABS 0.0 09/06/2021 0400    BMET    Component Value Date/Time   NA 139 08/28/2024 0241   NA 141 10/28/2015 1429   K 4.6 08/28/2024 0831   CL 114 (H) 08/28/2024 0241   CO2 19 (L) 08/28/2024 0241   GLUCOSE 90 08/28/2024 0241   BUN 76 (H) 08/28/2024 0241   BUN 22 10/28/2015 1429   CREATININE 6.74 (H) 08/28/2024 0241   CALCIUM  7.8 (L) 08/28/2024 0241   GFRNONAA 6 (L) 08/28/2024 0241   GFRAA 42 (L) 10/28/2015 1429    COAGS: Lab Results  Component Value Date   INR 1.0 09/06/2021     Non-Invasive Vascular Imaging:   BUE vein mapping ordered    ASSESSMENT/PLAN: This is a 78 y.o. 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     -pt is right hand dominant and she has palpable bilateral radial and ulnar pulses bilaterally.  She has an IV in the left forearm.  Will restrict left arm and have IV moved to the right arm.  She has vein mapping ordered for today. -given her hx  of MVCAD and multiple admissions for chest pain, will plan for [facility] placement only tomorrow and proceed with AVF/AVG in the future once her CAD has been addressed as once she has started dialysis, she most likely will undergo heart catheterization.   -pt is on asa/statin/plavix  -Dr. Gretta to evaluate pt and determine further plan   Lucie Apt, PA-C [facility] [phone]   I have seen and evaluated the patient. I  agree with the PA note as documented above.  78 year old female with CKD progressing to ESRD.  This is a new dialysis start.  She is right-handed.  No prior access in the past.  She is right handed.   We are asked to place AV fistula and TDC.  I think at a minimum we try and get a [facility] placed tomorrow given her cardiac history and keep n.p.o. after midnight.  Pending discussion with anesthesia we will decide about whether she could be a candidate for left arm fistula versus graft but this may be delayed given her significant cardiac history with prior evaluation for CABG versus PCI and CP this admission.  Lonni DOROTHA Gretta, MD [facility] [place] Office: [phone]

## 2024-08-28 NOTE — Consult Note (Addendum)
 Hospital Consult    Reason for Consult:  in need of dialysis access Requesting Physician:  Gearline MRN #:  [id number]  History of Present Illness: This is a 78 y.o. female who presented to the hospital with yesterday with chest pain while at rest. It resolved in about 30 minutes and she did receive NTG.   She has hx of MVCAD and was previously offered CABG vs high risk PCI at [facility] earlier this year, but declined due to fear of progression of her kidney disease.  She has been hospitalized several times in the past few months for chest pain.   In the ER, she was hyperkalemic with K+ of 5.8.  Her K+ this am was 6.1 and now down to 4.6. Vascular surgery is consulted for permanent dialysis access and [facility] placement.    She has been followed by vascular surgery in [place] for PAD and was last seen by them in 2020.  She does have hx of back pain with radiculopathy of BLE and has received ESI in the past.    She has hx of RF ablation in 2019.   Pt states she lives in [place], [place], which is [place] of [place].  She is here visiting her son, who went to [employer] and stayed in the area.  She states she is right hand dominant.  She has hx of PAD with sx in the right leg in the past.  She denies any non healing wounds or rest pain.  She states she has been told that CABG is no longer an option and that she would require a stent.    Pt was seen by Dr. Wendel on 08/19/2024 and per his note, her left heart cath at [facility] revealed  left main 70% stenosis, mid LAD 50%, diagonal 90% ostial, ostial left circumflex 70%, mid left circumflex 75% and occluded RCA.   The pt is on a statin for cholesterol management.  The pt is on a daily aspirin .   Other AC:  Plavix  The pt is on BB, diuretic for hypertension.   The pt is not on medication for diabetes PTA. Tobacco hx:  current  Past Medical History:  Diagnosis Date   Allergic rhinitis    Chronic heart failure with preserved ejection fraction (HFpEF) (HCC) 08/19/2024    Chronic respiratory failure with hypoxia (HCC) 08/19/2024   CKD (chronic kidney disease) stage 5, GFR less than 15 ml/min (HCC) 08/19/2024   COPD (chronic obstructive pulmonary disease) (HCC)    Diabetes mellitus without complication (HCC)    HLD (hyperlipidemia)    Hypertension    Migraine    Peripheral neuropathy     Past Surgical History:  Procedure Laterality Date   NO PAST SURGERIES      Allergies  Allergen Reactions   Other Other (See Comments)    PATIENT IS EXTREMELY SENSITIVE TO ALL PAIN MEDICATIONS PER FAMILY MEMBERS WHO MANAGE MEDS AT HOME.    Oxycodone Other (See Comments)    Drowsiness, shaking    Pregabalin Anxiety and Other (See Comments)    Altered mental status tremors     Prior to Admission medications   Medication Sig Start Date End Date Taking? Authorizing Provider  aspirin  EC 81 MG tablet Take 1 tablet (81 mg total) by mouth daily. 08/20/24   Leotis Bogus, MD  atorvastatin  (LIPITOR ) 80 MG tablet Take 1 tablet (80 mg total) by mouth daily. 08/20/24   Leotis Bogus, MD  carvedilol  (COREG ) 25 MG tablet Take 1 tablet (25 mg  total) by mouth 2 (two) times daily with a meal. 08/20/24   Leotis Bogus, MD  clopidogrel  (PLAVIX ) 75 MG tablet Take 1 tablet (75 mg total) by mouth daily. 08/20/24   Leotis Bogus, MD  docusate sodium (COLACE) 100 MG capsule Take 100 mg by mouth at bedtime.    [provider]  fluticasone (FLONASE) 50 MCG/ACT nasal spray Place 1 spray into both nostrils daily as needed for allergies or rhinitis. 03/21/24   [provider]  Fluticasone-Umeclidin-Vilant (TRELEGY ELLIPTA) 100-62.5-25 MCG/ACT AEPB Inhale 1 puff into the lungs daily at 6 (six) AM.    [provider]  furosemide  (LASIX ) 20 MG tablet Take 3 tablets (60 mg total) by mouth 2 (two) times daily. 08/20/24   Leotis Bogus, MD  isosorbide  mononitrate (IMDUR ) 30 MG 24 hr tablet Take 1 tablet (30 mg total) by mouth daily. 08/21/24   Leotis Bogus, MD   nitroGLYCERIN  (NITROSTAT ) 0.4 MG SL tablet Place 1 tablet (0.4 mg total) under the tongue every 5 (five) minutes as needed for chest pain. 08/20/24 08/20/25  Leotis Bogus, MD  sodium bicarbonate  650 MG tablet Take 650 mg by mouth 2 (two) times daily.    [provider]    Social History   Socioeconomic History   Marital status: Single    Spouse name: Not on file   Number of children: 1   Years of education: 12   Highest education level: Not on file  Occupational History   Occupation: Retired  Tobacco Use   Smoking status: Every Day   Smokeless tobacco: Never  Substance and Sexual Activity   Alcohol use: No    Alcohol/week: 0.0 standard drinks of alcohol   Drug use: No   Sexual activity: Not on file  Other Topics Concern   Not on file  Social History Narrative   Lives alone   Caffeine use: Daily    Social Drivers of Health   Financial Resource Strain: Low Risk  (05/28/2024)   Received from [facility]   Overall Financial Resource Strain (CARDIA)    Difficulty of Paying Living Expenses: Not very hard  Food Insecurity: No Food Insecurity (08/27/2024)   Hunger Vital Sign    Worried About Running Out of Food in the Last Year: Never true    Ran Out of Food in the Last Year: Never true  Transportation Needs: No Transportation Needs (08/27/2024)   PRAPARE - Administrator, Civil Service (Medical): No    Lack of Transportation (Non-Medical): No  Physical Activity: Insufficiently Active (11/15/2023)   Received from [facility]   Exercise Vital Sign    On average, how many days per week do you engage in moderate to strenuous exercise (like a brisk walk)?: 7 days    On average, how many minutes do you engage in exercise at this level?: 10 min  Stress: No Stress Concern Present (11/15/2023)   Received from [facility] - Occupational Stress Questionnaire    Feeling of Stress : Not at all  Social  Connections: Moderately Integrated (08/27/2024)   Social Connection and Isolation Panel    Frequency of Communication with Friends and Family: More than three times a week    Frequency of Social Gatherings with Friends and Family: More than three times a week    Attends Religious Services: More than 4 times per year    Active Member of [employer] or Organizations: Yes  Attends Banker Meetings: More than 4 times per year    Marital Status: Never married  Intimate Partner Violence: Not At Risk (08/27/2024)   Humiliation, Afraid, Rape, and Kick questionnaire    Fear of Current or Ex-Partner: No    Emotionally Abused: No    Physically Abused: No    Sexually Abused: No     Family History  Problem Relation Age of Onset   Neurofibromatosis Neg Hx     ROS: [x]  Positive   [ ]  Negative   [ ]  All sytems reviewed and are negative  Cardiac: [x]  chest pain/pressure []  hx MI []  SOB   Vascular: [x]  hx PAD   Pulmonary: []  asthma/wheezing []  home O2  Neurologic: []  hx of CVA []  mini stroke   Hematologic: []  hx of cancer  Endocrine:   []  diabetes []  thyroid  disease  GI []  GERD  GU: [x]  CKD/renal failure []  HD--[]  M/W/F or []  T/T/S  Psychiatric: []  anxiety []  depression  Musculoskeletal: []  arthritis []  joint pain  Integumentary: []  rashes []  ulcers  Constitutional: []  fever  []  chills  Physical Examination  Vitals:   08/28/24 0354 08/28/24 0807  BP: (!) 156/65 (!) 135/53  Pulse: 65 65  Resp: (!) 21 19  Temp: 98.6 F (37 C) 97.9 F (36.6 C)  SpO2: 100% 100%   Body mass index is 29.23 kg/m.  General:  WDWN in NAD Gait: Not observed HENT: WNL, normocephalic Pulmonary: normal non-labored breathing Cardiac: regular Skin: without rashes Vascular Exam/Pulses: 2+ palpable bilateral radial and ulnar pulses.   Pedal pulses are not palpable Extremities: IV left forearm Musculoskeletal: no muscle wasting or atrophy  Neurologic: A&O X  3 Psychiatric:  The pt has Normal affect.   CBC    Component Value Date/Time   WBC 5.9 08/28/2024 0241   RBC 3.55 (L) 08/28/2024 0241   HGB 9.8 (L) 08/28/2024 0241   HCT 31.7 (L) 08/28/2024 0241   PLT 166 08/28/2024 0241   MCV 89.3 08/28/2024 0241   MCH 27.6 08/28/2024 0241   MCHC 30.9 08/28/2024 0241   RDW 15.7 (H) 08/28/2024 0241   LYMPHSABS 0.3 (L) 09/06/2021 0400   MONOABS 0.1 09/06/2021 0400   EOSABS 0.0 09/06/2021 0400   BASOSABS 0.0 09/06/2021 0400    BMET    Component Value Date/Time   NA 139 08/28/2024 0241   NA 141 10/28/2015 1429   K 4.6 08/28/2024 0831   CL 114 (H) 08/28/2024 0241   CO2 19 (L) 08/28/2024 0241   GLUCOSE 90 08/28/2024 0241   BUN 76 (H) 08/28/2024 0241   BUN 22 10/28/2015 1429   CREATININE 6.74 (H) 08/28/2024 0241   CALCIUM  7.8 (L) 08/28/2024 0241   GFRNONAA 6 (L) 08/28/2024 0241   GFRAA 42 (L) 10/28/2015 1429    COAGS: Lab Results  Component Value Date   INR 1.0 09/06/2021     Non-Invasive Vascular Imaging:   BUE vein mapping ordered    ASSESSMENT/PLAN: This is a 78 y.o. 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     -pt is right hand dominant and she has palpable bilateral radial and ulnar pulses bilaterally.  She has an IV in the left forearm.  Will restrict left arm and have IV moved to the right arm.  She has vein mapping ordered for today. -given her hx  of MVCAD and multiple admissions for chest pain, will plan for [facility] placement only tomorrow and proceed with AVF/AVG in the future once her CAD has been addressed as once she has started dialysis, she most likely will undergo heart catheterization.   -pt is on asa/statin/plavix  -Dr. Gretta to evaluate pt and determine further plan   Lucie Apt, PA-C [facility] [phone]   I have seen and evaluated the patient. I  agree with the PA note as documented above.  78 year old female with CKD progressing to ESRD.  This is a new dialysis start.  She is right-handed.  No prior access in the past.  She is right handed.   We are asked to place AV fistula and TDC.  I think at a minimum we try and get a [facility] placed tomorrow given her cardiac history and keep n.p.o. after midnight.  Pending discussion with anesthesia we will decide about whether she could be a candidate for left arm fistula versus graft but this may be delayed given her significant cardiac history with prior evaluation for CABG versus PCI and CP this admission.  Lonni DOROTHA Gretta, MD [facility] [place] Office: [phone]

## 2024-08-29 ENCOUNTER — Inpatient Hospital Stay (HOSPITAL_COMMUNITY)

## 2024-08-29 ENCOUNTER — Encounter (HOSPITAL_COMMUNITY): Payer: Self-pay | Admitting: Family Medicine

## 2024-08-29 ENCOUNTER — Encounter (HOSPITAL_COMMUNITY): Admission: EM | Disposition: A | Payer: Self-pay | Source: Home / Self Care | Attending: Internal Medicine

## 2024-08-29 DIAGNOSIS — Z992 Dependence on renal dialysis: Secondary | ICD-10-CM | POA: Diagnosis not present

## 2024-08-29 DIAGNOSIS — N186 End stage renal disease: Secondary | ICD-10-CM

## 2024-08-29 DIAGNOSIS — I2510 Atherosclerotic heart disease of native coronary artery without angina pectoris: Secondary | ICD-10-CM | POA: Diagnosis not present

## 2024-08-29 HISTORY — PX: INSERTION OF DIALYSIS CATHETER: SHX1324

## 2024-08-29 LAB — GLUCOSE, CAPILLARY
Glucose-Capillary: 83 mg/dL (ref 70–99)
Glucose-Capillary: 109 mg/dL — ABNORMAL HIGH (ref 70–99)
Glucose-Capillary: 160 mg/dL — ABNORMAL HIGH (ref 70–99)
Glucose-Capillary: 85 mg/dL (ref 70–99)
Glucose-Capillary: 133 mg/dL — ABNORMAL HIGH (ref 70–99)

## 2024-08-29 LAB — CBC
HCT: 33.3 % — ABNORMAL LOW (ref 36.0–46.0)
Hemoglobin: 10.5 g/dL — ABNORMAL LOW (ref 12.0–15.0)
MCH: 27.8 pg (ref 26.0–34.0)
MCHC: 31.5 g/dL (ref 30.0–36.0)
MCV: 88.1 fL (ref 80.0–100.0)
Platelets: 166 K/uL (ref 150–400)
RBC: 3.78 MIL/uL — ABNORMAL LOW (ref 3.87–5.11)
RDW: 15.5 % (ref 11.5–15.5)
WBC: 6 K/uL (ref 4.0–10.5)
nRBC: 0 % (ref 0.0–0.2)

## 2024-08-29 LAB — HEPATITIS B SURFACE ANTIGEN: Hepatitis B Surface Ag: NONREACTIVE

## 2024-08-29 LAB — BASIC METABOLIC PANEL WITH GFR
Anion gap: 10 (ref 5–15)
BUN: 70 mg/dL — ABNORMAL HIGH (ref 8–23)
CO2: 21 mmol/L — ABNORMAL LOW (ref 22–32)
Calcium: 8.1 mg/dL — ABNORMAL LOW (ref 8.9–10.3)
Chloride: 109 mmol/L (ref 98–111)
Creatinine, Ser: 6.03 mg/dL — ABNORMAL HIGH (ref 0.44–1.00)
GFR, Estimated: 7 mL/min — ABNORMAL LOW (ref >60)
Glucose, Bld: 89 mg/dL (ref 70–99)
Potassium: 4.6 mmol/L (ref 3.5–5.1)
Sodium: 140 mmol/L (ref 135–145)

## 2024-08-29 LAB — SURGICAL PCR SCREEN
MRSA, PCR: NEGATIVE
Staphylococcus aureus: NEGATIVE

## 2024-08-29 SURGERY — INSERTION OF DIALYSIS CATHETER
Anesthesia: Monitor Anesthesia Care | Site: Chest | Laterality: Right

## 2024-08-29 MED ORDER — HEPARIN SODIUM (PORCINE) 1000 UNIT/ML IJ SOLN
INTRAMUSCULAR | Status: AC
  Filled 2024-08-29: qty 10

## 2024-08-29 MED ORDER — IPRATROPIUM-ALBUTEROL 0.5-2.5 (3) MG/3ML IN SOLN
3.0000 mL | Freq: Once | RESPIRATORY_TRACT | Status: AC
  Administered 2024-08-29: 3 mL via RESPIRATORY_TRACT
  Filled 2024-08-29: qty 3

## 2024-08-29 MED ORDER — ROCURONIUM BROMIDE 10 MG/ML (PF) SYRINGE
PREFILLED_SYRINGE | INTRAVENOUS | Status: AC
  Filled 2024-08-29: qty 10

## 2024-08-29 MED ORDER — ONDANSETRON HCL 4 MG/2ML IJ SOLN
INTRAMUSCULAR | Status: AC
  Filled 2024-08-29: qty 2

## 2024-08-29 MED ORDER — LIDOCAINE HCL (PF) 1 % IJ SOLN: 5.0000 mL | INTRAMUSCULAR | Status: DC | PRN

## 2024-08-29 MED ORDER — VASOPRESSIN 20 UNIT/ML IV SOLN
INTRAVENOUS | Status: AC
  Filled 2024-08-29: qty 1

## 2024-08-29 MED ORDER — HEPARIN 6000 UNIT IRRIGATION SOLUTION
Status: AC
  Filled 2024-08-29: qty 500

## 2024-08-29 MED ORDER — PROPOFOL 10 MG/ML IV BOLUS
INTRAVENOUS | Status: AC
  Filled 2024-08-29: qty 20

## 2024-08-29 MED ORDER — PENTAFLUOROPROP-TETRAFLUOROETH EX AERO: 1.0000 | INHALATION_SPRAY | CUTANEOUS | Status: DC | PRN

## 2024-08-29 MED ORDER — LIDOCAINE-PRILOCAINE 2.5-2.5 % EX CREA: 1.0000 | TOPICAL_CREAM | CUTANEOUS | Status: DC | PRN

## 2024-08-29 MED ORDER — ALTEPLASE 2 MG IJ SOLR: 2.0000 mg | Freq: Once | INTRAMUSCULAR | Status: DC | PRN

## 2024-08-29 MED ORDER — LIDOCAINE-EPINEPHRINE (PF) 1 %-1:200000 IJ SOLN
INTRAMUSCULAR | Status: AC
  Filled 2024-08-29: qty 30

## 2024-08-29 MED ORDER — CHLORHEXIDINE GLUCONATE 0.12 % MT SOLN: 15.0000 mL | Freq: Once | OROMUCOSAL | Status: AC

## 2024-08-29 MED ORDER — ANTICOAGULANT SODIUM CITRATE 4% (200MG/5ML) IV SOLN: 5.0000 mL | Status: DC | PRN

## 2024-08-29 MED ORDER — FENTANYL CITRATE (PF) 100 MCG/2ML IJ SOLN
INTRAMUSCULAR | Status: DC | PRN
  Administered 2024-08-29: 25 ug via INTRAVENOUS

## 2024-08-29 MED ORDER — ORAL CARE MOUTH RINSE: 15.0000 mL | Freq: Once | OROMUCOSAL | Status: AC

## 2024-08-29 MED ORDER — SODIUM CHLORIDE 0.9 % IV SOLN: INTRAVENOUS | Status: DC

## 2024-08-29 MED ORDER — HEPARIN SODIUM (PORCINE) 1000 UNIT/ML IJ SOLN
INTRAMUSCULAR | Status: DC | PRN
  Administered 2024-08-29: 1600 [IU]

## 2024-08-29 MED ORDER — CHLORHEXIDINE GLUCONATE CLOTH 2 % EX PADS
6.0000 | MEDICATED_PAD | Freq: Every day | CUTANEOUS | Status: DC
  Administered 2024-08-29 – 2024-09-01 (×4): 6 via TOPICAL

## 2024-08-29 MED ORDER — FENTANYL CITRATE (PF) 100 MCG/2ML IJ SOLN
INTRAMUSCULAR | Status: AC
  Filled 2024-08-29: qty 2

## 2024-08-29 MED ORDER — SUGAMMADEX SODIUM 200 MG/2ML IV SOLN
INTRAVENOUS | Status: AC
  Filled 2024-08-29: qty 2

## 2024-08-29 MED ORDER — LIDOCAINE 2% (20 MG/ML) 5 ML SYRINGE
INTRAMUSCULAR | Status: AC
  Filled 2024-08-29: qty 5

## 2024-08-29 MED ORDER — 0.9 % SODIUM CHLORIDE (POUR BTL) OPTIME
TOPICAL | Status: DC | PRN
  Administered 2024-08-29: 1000 mL

## 2024-08-29 MED ORDER — HEPARIN SODIUM (PORCINE) 1000 UNIT/ML DIALYSIS: 1000.0000 [IU] | INTRAMUSCULAR | Status: DC | PRN

## 2024-08-29 MED ORDER — HEPARIN 6000 UNIT IRRIGATION SOLUTION
Status: DC | PRN
  Administered 2024-08-29: 1

## 2024-08-29 MED ORDER — CHLORHEXIDINE GLUCONATE 0.12 % MT SOLN
OROMUCOSAL | Status: AC
  Administered 2024-08-29: 15 mL via OROMUCOSAL
  Filled 2024-08-29: qty 15

## 2024-08-29 MED ORDER — LIDOCAINE-EPINEPHRINE (PF) 1 %-1:200000 IJ SOLN
INTRAMUSCULAR | Status: DC | PRN
  Administered 2024-08-29: 12 mL

## 2024-08-29 MED ORDER — HEPARIN SODIUM (PORCINE) 1000 UNIT/ML IJ SOLN
INTRAMUSCULAR | Status: AC
  Filled 2024-08-29: qty 15

## 2024-08-29 MED ORDER — LIDOCAINE HCL (PF) 1 % IJ SOLN
INTRAMUSCULAR | Status: AC
  Filled 2024-08-29: qty 30

## 2024-08-29 MED ORDER — EPHEDRINE 5 MG/ML INJ
INTRAVENOUS | Status: AC
  Filled 2024-08-29: qty 5

## 2024-08-29 MED ORDER — PHENYLEPHRINE 80 MCG/ML (10ML) SYRINGE FOR IV PUSH (FOR BLOOD PRESSURE SUPPORT)
PREFILLED_SYRINGE | INTRAVENOUS | Status: AC
  Filled 2024-08-29: qty 10

## 2024-08-29 SURGICAL SUPPLY — 30 items
BAG COUNTER SPONGE SURGICOUNT (BAG) ×1 IMPLANT
BIOPATCH RED 1 DISK 7.0 (GAUZE/BANDAGES/DRESSINGS) ×1 IMPLANT
CANISTER SUCTION 3000ML PPV (SUCTIONS) ×1 IMPLANT
CATH PALINDROME-P 19CM W/VT (CATHETERS) IMPLANT
CLIP TI MEDIUM 24 (CLIP) IMPLANT
CLIP TI WIDE RED SMALL 24 (CLIP) ×1 IMPLANT
COVER PROBE W GEL 5X96 (DRAPES) ×1 IMPLANT
COVER SURGICAL LIGHT HANDLE (MISCELLANEOUS) ×1 IMPLANT
DERMABOND ADVANCED .7 DNX12 (GAUZE/BANDAGES/DRESSINGS) ×1 IMPLANT
DRAPE C-ARM 42X72 X-RAY (DRAPES) ×1 IMPLANT
DRAPE CHEST BREAST 15X10 FENES (DRAPES) ×1 IMPLANT
ELECTRODE REM PT RTRN 9FT ADLT (ELECTROSURGICAL) ×1 IMPLANT
GLOVE BIOGEL PI IND STRL 7.0 (GLOVE) ×1 IMPLANT
GOWN STRL REUS W/ TWL LRG LVL3 (GOWN DISPOSABLE) ×2 IMPLANT
GOWN STRL REUS W/ TWL XL LVL3 (GOWN DISPOSABLE) ×1 IMPLANT
HIBICLENS CHG 4% 4OZ (MISCELLANEOUS) IMPLANT
INSERT FOGARTY SM (MISCELLANEOUS) ×1 IMPLANT
KIT BASIN OR (CUSTOM PROCEDURE TRAY) ×1 IMPLANT
KIT TURNOVER KIT B (KITS) ×1 IMPLANT
PACK CV ACCESS (CUSTOM PROCEDURE TRAY) ×1 IMPLANT
PAD ARMBOARD POSITIONER FOAM (MISCELLANEOUS) ×2 IMPLANT
POSITIONER HEAD DONUT 9IN (MISCELLANEOUS) IMPLANT
SET MICROPUNCTURE 5F STIFF (MISCELLANEOUS) IMPLANT
SOLN 0.9% NACL POUR BTL 1000ML (IV SOLUTION) ×1 IMPLANT
SOLN STERILE WATER BTL 1000 ML (IV SOLUTION) ×1 IMPLANT
SUT ETHILON 3 0 PS 1 (SUTURE) ×1 IMPLANT
SUT MNCRL AB 4-0 PS2 18 (SUTURE) ×1 IMPLANT
SYR 10ML LL (SYRINGE) ×1 IMPLANT
TOWEL GREEN STERILE (TOWEL DISPOSABLE) ×1 IMPLANT
TOWEL GREEN STERILE FF (TOWEL DISPOSABLE) ×2 IMPLANT

## 2024-08-29 NOTE — Progress Notes (Signed)
 0 Ultrafiltration, condition stable  post hemodialysis, treatment #1, transportation provided to this patients room without complication, and report was given to the RN assuming care.

## 2024-08-29 NOTE — TOC Progression Note (Signed)
 Transition of Care [facility]) - Progression Note    Patient Details  Name: Sabrina Hanson MRN: [id number] Date of Birth: [birth year]  Transition of Care [facility]) CM/SW Contact  [name], [place] Phone Number: 08/29/2024, 3:15 PM  Clinical Narrative:   Per Renal Navigator, patient requesting transportation resources. CSW spoke with patients son that Renal navigator stated she'd be staying with. Patients son stated he is patients support and can help assist transporting patient to HD. CSW informed [name] (son) that CSW placed transportation resources on patients AVS. CSW encouraged [name] to look into [facility] [place] and to follow up with Dialysis Clinic SW for further assistance.   [name], MSW, LCSWA Transitions of Care [phone]    Expected Discharge Plan: Home w Home Health Services Barriers to Discharge: Continued Medical Work up               Expected Discharge Plan and Services In-house Referral: NA Discharge Planning Services: CM Consult Post Acute Care Choice: NA Living arrangements for the past 2 months: Single Family Home                 DME Arranged: N/A DME Agency: NA       HH Arranged: NA           Social Drivers of Health (SDOH) Interventions SDOH Screenings   Food Insecurity: No Food Insecurity (08/27/2024)  Housing: Low Risk (08/27/2024)  Transportation Needs: No Transportation Needs (08/27/2024)  Utilities: Not At Risk (08/27/2024)  Financial Resource Strain: Low Risk  (05/28/2024)   Received from [facility]  Physical Activity: Insufficiently Active (11/15/2023)   Received from [facility]  Social Connections: Moderately Integrated (08/27/2024)  Stress: No Stress Concern Present (11/15/2023)   Received from [facility]  Tobacco Use: High Risk (08/29/2024)  Health Literacy: Low Risk (11/15/2023)   Received from [facility]    Readmission Risk Interventions    08/28/2024   11:57 AM  Readmission Risk Prevention  Plan  Transportation Screening Complete  PCP or Specialist Appt within 3-5 Days Complete  HRI or Home Care Consult Complete  Palliative Care Screening Not Applicable  Medication Review (RN Care Manager) Complete

## 2024-08-29 NOTE — Op Note (Signed)
° ° °  Patient name: Cadience Antonini MRN: [id number] DOB: [birth year] Sex: female  08/29/2024 Pre-operative Diagnosis: ESRD on HD Post-operative diagnosis:  Same Surgeon:  Norman GORMAN Serve, MD Procedure Performed:  Ultrasound and fluoroscopic guided right IJ tunneled dialysis catheter placement   Indications: Ms. Cyndie is a 77 year old female who has been admitted with progression of her kidney disease.  Nephrology not believe she needs to start dialysis and we were consulted for access.  She has multivessel cardiovascular disease and was undergoing workup at [facility] for high risk PCI versus CABG.  Given her significant CAD we discussed placement of a tunneled dialysis catheter and delaying arm access until she gets her coronary revascularization.  Risks and benefits were reviewed and she elected to proceed.  Findings:  Compressible patent right internal jugular Successful placement of the tunneled dialysis catheter at the atriocaval junction   Procedure:   The patient was brought to the operating room positioned supine on operating room table.  The neck was prepped and draped in the usual sterile fashion.  Anesthesia was induced, preoperative antibiotics were administered and a timeout was performed. Using ultrasound guidance the right internal jugular vein was accessed with micropuncture technique.  Through the micropuncture sheath, the guidewire was advanced into the superior vena cava.  A small incision was made around the skin access point.  The access point was serially dilated under direct fluoroscopic guidance.  A peel-away sheath was introduced into the superior vena cava under fluoroscopic guidance.  A counterincision was made in the chest under the clavicle.  A 19 cm tunnel dialysis catheter was then tunneled under the skin, over the clavicle into the incision in the neck.  The tunneling device was removed and the catheter fed through the peel-away sheath into the superior vena cava.  The  peel-away sheath was removed and the catheter gently pulled back.  Adequate position was confirmed with x-ray.  The catheter was tested and found to aspirate and flush with ease.    The catheter was sutured to the skin and the neck incision was closed with 4-0 Monocryl.  The catheter was then capped and heparin  locked.   Norman GORMAN Serve MD [facility] Office: [phone]

## 2024-08-29 NOTE — Discharge Instructions (Addendum)
 TRANSPORTATION: -[facility] Department of Health: Call [facility] and [employer] at [phone] for details. [email or web address]  -[employer] [place]: [employer] [place] is the [employer]'s shared-ride transportation service for eligible riders who have a disability that prevents them from riding the fixed route bus. Call [phone]. [employer] [place] riders must pay a fare of $1.50 per trip, or may purchase a 10-ride punch card for $14.00 ($1.40 per ride) or a 40-ride punch card for $48.00 ($1.20 per ride).  -[employer] rideshare transportation service is provided for senior citizens (60+) who live independently within [place] city limits and are unable to drive or have limited access to transportation. Call [phone] to schedule an appointment.  -[place] [facility]: For Medicare or Medicaid recipients call [phone]?[facility], wheelchair Zenaida Niece, and ambulatory quotes available.

## 2024-08-29 NOTE — Progress Notes (Signed)
 Requested to see pt for out-pt HD needs at d/c. Met with pt at bedside. Introduced self and explained role. Pt states she will be staying with son in the [place] area at d/c. Pt unsure how long that will be but pt states for at least a month or longer. Pt prefers a clinic close to son's home if possible. Referral submitted to [facility] admissions today for review. Pt requesting transportation resources to/from HD. Navigator contacted RN CM with pt's request for assistance with resources. Will assist as needed.   Randine Mungo Dialysis Navigator [phone]

## 2024-08-29 NOTE — Transfer of Care (Signed)
 Immediate Anesthesia Transfer of Care Note  Patient: Sabrina Hanson  Procedure(s) Performed: RIGHT INSERTION OF DIALYSIS CATHETER (Right: Chest)  Patient Location: PACU  Anesthesia Type:MAC  Level of Consciousness: awake, alert , and oriented  Airway & Oxygen Therapy: Patient Spontanous Breathing  Post-op Assessment: Report given to RN and Post -op Vital signs reviewed and stable  Post vital signs: Reviewed and stable  Last Vitals:  Vitals Value Taken Time  BP 153/53 08/29/24 09:08  Temp 37.2 C 08/29/24 09:08  Pulse 62 08/29/24 09:12  Resp 21 08/29/24 09:12  SpO2 92 % 08/29/24 09:12  Vitals shown include unfiled device data.  Last Pain:  Vitals:   08/29/24 0650  TempSrc: Oral  PainSc:          Complications: No notable events documented.

## 2024-08-29 NOTE — Progress Notes (Signed)
 [name] NEPHROLOGY PROGRESS NOTE  Assessment/ Plan: Pt is a 78 y.o. yo female with past medical history significant for HTN, HLD, type II DM, COPD, advanced CKD was presented with chest pain, hyperkalemia and worsening renal failure.  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  Status post tunneled HD catheter placed by [facility] on 12/11 with plan to do dialysis today.  Plan for permanent access placement pending cardiac workup.  Contacted renal navigator to arrange OP dialysis.  # Hyperkalemia: Status post medical management now starting dialysis.  Discontinue Lokelma .  Follow lab.  # Hypertension/volume: UF with HD, currently on carvedilol .  # Anemia of CKD: Iron saturation 32% and hemoglobin improved to 10.5.  Monitor hemoglobin and assess for erythropoietin need.  # CKD-MBD: Check PTH level.  Monitor calcium , phosphorus level.  Subjective: Seen and examined at bedside.  Patient reports feeling tired.  She had right IJ tunneled HD catheter placement today.  Plan for dialysis.  Denies chest pain or shortness of breath. Objective Vital signs in last 24 hours: Vitals:   08/29/24 0915 08/29/24 0930 08/29/24 0956 08/29/24 1030  BP: (!) 138/59 (!) 142/59 129/61 (!) 176/56  Pulse: 62 60 60 60  Resp: 17 15 17 17   Temp:  98.3 F (36.8 C) 98.5 F (36.9 C) 98.4 F (36.9 C)  TempSrc:   Oral Oral  SpO2: 99% 96% 100% 97%  Weight:      Height:       Weight change: 0.2 kg  Intake/Output Summary (Last 24 hours) at 08/29/2024 1305 Last data filed at 08/29/2024 9147 Gross per 24 hour  Intake 870 ml  Output 1115 ml  Net -245 ml       Labs: RENAL PANEL Recent Labs  Lab 08/27/24 0204 08/27/24 0355 08/27/24 1748 08/28/24 0241 08/28/24 0831 08/29/24 0252  NA 136 141 139 139  --  140  K 5.8* 5.5* 5.4* 6.1* 4.6 4.6  CL 112* 120* 113* 114*  --  109  CO2 15* 10* 18* 19*  --  21*  GLUCOSE 201* 109*  95 90  --  89  BUN 78* 68* 75* 76*  --  70*  CREATININE 6.71* 5.68* 6.52* 6.74*  --  6.03*  CALCIUM  7.5* 6.7* 7.9* 7.8*  --  8.1*    Liver Function Tests: No results for input(s): AST, ALT, ALKPHOS, BILITOT, PROT, ALBUMIN  in the last 168 hours. No results for input(s): LIPASE, AMYLASE in the last 168 hours. No results for input(s): AMMONIA in the last 168 hours. CBC: Recent Labs    08/27/24 0204 08/27/24 0355 08/27/24 1748 08/28/24 0241 08/28/24 1122 08/29/24 0252  HGB 6.9* 5.9* 9.9* 9.8*  --  10.5*  MCV 90.7 91.8 88.8 89.3  --  88.1  VITAMINB12  --   --   --   --  363  --   FOLATE  --   --   --   --  10.8  --   FERRITIN  --   --   --   --  250  --   TIBC  --   --   --   --  225*  --   IRON  --   --   --   --  71  --   RETICCTPCT  --   --   --   --  1.4  --     Cardiac Enzymes: No results for input(s):  CKTOTAL, CKMB, CKMBINDEX, TROPONINI in the last 168 hours. CBG: Recent Labs  Lab 08/28/24 1657 08/28/24 2118 08/29/24 0557 08/29/24 0910 08/29/24 1148  GLUCAP 96 121* 83 109* 160*    Iron Studies:  Recent Labs    08/28/24 1122  IRON 71  TIBC 225*  FERRITIN 250   Studies/Results: DG Chest Port 1 View Result Date: 08/29/2024 EXAM: XR Chest, 1 View(s). CLINICAL HISTORY: 78 year old female. Status post dialysis catheter insertion. COMPARISON: 08/27/2024. Thyroid  ultrasound 08/27/2024. FINDINGS: LUNGS: Mild linear subsegmental atelectasis or scarring in left mid lung. PLEURAL SPACES: No pleural effusion or pneumothorax. HEART: The heart size is normal. Dense aortic arch calcifications. 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 No acute osseous abnormality. LINES AND TUBES: Right IJ dual lumen central venous catheter in place with tip at cavoatrial junction. IMPRESSION: 1. Right internal jugular dual-lumen central venous catheter with tip at the cavoatrial junction. 2. Mild linear  atelectasis or scarring in the left mid lung. 3. No other acute cardiopulmonary abnormality. 4. 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 Electronically signed by: Helayne Hurst MD 08/29/2024 09:43 AM EST RP Workstation: [id number]   DG C-Arm 1-60 Min-No Report Result Date: 08/29/2024 Fluoroscopy was utilized by the requesting physician.  No radiographic interpretation.   ECHOCARDIOGRAM COMPLETE Result Date: 08/28/2024    ECHOCARDIOGRAM REPORT   Patient Name:   Sabrina Hanson Date of Exam: 08/28/2024 Medical Rec #:  [id number]     Height:       62.0 in Accession #:    [id number]    Weight:       159.8 lb Date of Birth:  January 11, 1946    BSA:          1.738 m Patient Age:    77 years      BP:           150/66 mmHg Patient Gender: F             HR:           61 bpm. Exam Location:  Inpatient Procedure: 2D Echo, Cardiac Doppler and Color Doppler (Both Spectral and Color            Flow Doppler were utilized during procedure). Indications:    CHF - Acute Diastolic I50.31  History:        Patient has no prior history of Echocardiogram examinations.                 CHF, Previous Myocardial Infarction and Angina, COPD and CKD,                 stage 5, Signs/Symptoms:Shortness of Breath; Risk                 Factors:Dyslipidemia and Diabetes.  Sonographer:    Thea Norlander RCS Referring Phys: [phone] PREETHA JOSEPH IMPRESSIONS  1. Left ventricular ejection fraction, by estimation, is 60 to 65%. The left ventricle has normal function. The left ventricle has no regional wall motion abnormalities. Left ventricular diastolic parameters are consistent with Grade I diastolic dysfunction (impaired relaxation). Elevated left atrial pressure.  2. Right ventricular systolic function is normal. The right ventricular size is mildly enlarged. There is mildly elevated pulmonary artery systolic pressure. The estimated right ventricular systolic pressure is 37.3 mmHg.  3. Right atrial size  was mildly dilated.  4. The mitral valve is degenerative. No evidence of mitral valve regurgitation.  Mild mitral stenosis. The mean mitral valve gradient is 4.5 mmHg. Severe mitral annular calcification.  5. Tricuspid valve regurgitation is mild to moderate.  6. The aortic valve is tricuspid. There is mild calcification of the aortic valve. Aortic valve regurgitation is not visualized. No aortic stenosis is present. Aortic valve area, by VTI measures 2.31 cm. Aortic valve mean gradient measures 7.0 mmHg. Aortic valve Vmax measures 1.73 m/s.  7. The inferior vena cava is normal in size with greater than 50% respiratory variability, suggesting right atrial pressure of 3 mmHg. FINDINGS  Left Ventricle: Left ventricular ejection fraction, by estimation, is 60 to 65%. The left ventricle has normal function. The left ventricle has no regional wall motion abnormalities. The left ventricular internal cavity size was normal in size. There is  no left ventricular hypertrophy. Left ventricular diastolic parameters are consistent with Grade I diastolic dysfunction (impaired relaxation). Elevated left atrial pressure. Right Ventricle: The right ventricular size is mildly enlarged. No increase in right ventricular wall thickness. Right ventricular systolic function is normal. There is mildly elevated pulmonary artery systolic pressure. The tricuspid regurgitant velocity is 2.93 m/s, and with an assumed right atrial pressure of 3 mmHg, the estimated right ventricular systolic pressure is 37.3 mmHg. Left Atrium: Left atrial size was normal in size. Right Atrium: Right atrial size was mildly dilated. Pericardium: There is no evidence of pericardial effusion. Mitral Valve: The mitral valve is degenerative in appearance. Severe mitral annular calcification. No evidence of mitral valve regurgitation. Mild mitral valve stenosis. MV peak gradient, 11.7 mmHg. The mean mitral valve gradient is 4.5 mmHg. Tricuspid Valve: The tricuspid  valve is normal in structure. Tricuspid valve regurgitation is mild to moderate. No evidence of tricuspid stenosis. Aortic Valve: The aortic valve is tricuspid. There is mild calcification of the aortic valve. Aortic valve regurgitation is not visualized. No aortic stenosis is present. Aortic valve mean gradient measures 7.0 mmHg. Aortic valve peak gradient measures 12.0 mmHg. Aortic valve area, by VTI measures 2.31 cm. Pulmonic Valve: The pulmonic valve was normal in structure. Pulmonic valve regurgitation is not visualized. No evidence of pulmonic stenosis. Aorta: The aortic root is normal in size and structure. Venous: The inferior vena cava is normal in size with greater than 50% respiratory variability, suggesting right atrial pressure of 3 mmHg. IAS/Shunts: No atrial level shunt detected by color flow Doppler.  LEFT VENTRICLE PLAX 2D LVIDd:         4.80 cm     Diastology LVIDs:         3.20 cm     LV e' medial:    5.70 cm/s LV PW:         1.00 cm     LV E/e' medial:  26.7 LV IVS:        1.10 cm     LV e' lateral:   8.85 cm/s LVOT diam:     1.90 cm     LV E/e' lateral: 17.2 LV SV:         97 LV SV Index:   56 LVOT Area:     2.84 cm LV IVRT:       81 msec  LV Volumes (MOD) LV vol d, MOD A2C: 88.7 ml LV vol d, MOD A4C: 73.2 ml LV vol s, MOD A2C: 32.2 ml LV vol s, MOD A4C: 25.0 ml LV SV MOD A2C:     56.5 ml LV SV MOD A4C:     73.2 ml LV SV MOD BP:  53.4 ml RIGHT VENTRICLE             IVC RV S prime:     12.10 cm/s  IVC diam: 2.20 cm TAPSE (M-mode): 2.9 cm LEFT ATRIUM             Index        RIGHT ATRIUM           Index LA diam:        3.20 cm 1.84 cm/m   RA Area:     19.10 cm LA Vol (A2C):   51.1 ml 29.40 ml/m  RA Volume:   55.70 ml  32.05 ml/m LA Vol (A4C):   56.1 ml 32.28 ml/m LA Biplane Vol: 54.3 ml 31.24 ml/m  AORTIC VALVE AV Area (Vmax):    2.43 cm AV Area (Vmean):   2.30 cm AV Area (VTI):     2.31 cm AV Vmax:           173.00 cm/s AV Vmean:          121.000 cm/s AV VTI:            0.418 m AV  Peak Grad:      12.0 mmHg AV Mean Grad:      7.0 mmHg LVOT Vmax:         148.00 cm/s LVOT Vmean:        98.300 cm/s LVOT VTI:          0.341 m LVOT/AV VTI ratio: 0.82  AORTA Ao Root diam: 2.70 cm Ao Asc diam:  2.90 cm MITRAL VALVE                TRICUSPID VALVE MV Area (PHT): 3.12 cm     TR Peak grad:   34.3 mmHg MV Area VTI:   1.86 cm     TR Vmax:        293.00 cm/s MV Peak grad:  11.7 mmHg MV Mean grad:  4.5 mmHg     SHUNTS MV Vmax:       1.71 m/s     Systemic VTI:  0.34 m MV Vmean:      101.4 cm/s   Systemic Diam: 1.90 cm MV Decel Time: 243 msec MR Peak grad: 63.8 mmHg MR Vmax:      399.50 cm/s MV E velocity: 152.00 cm/s MV A velocity: 178.00 cm/s MV E/A ratio:  0.85 Oneil Parchment MD Electronically signed by Oneil Parchment MD Signature Date/Time: 08/28/2024/5:07:42 PM    Final     Medications: Infusions:   Scheduled Medications:  sodium chloride    Intravenous Once   aspirin  EC  81 mg Oral Daily   atorvastatin   80 mg Oral Daily   budesonide -glycopyrrolate -formoterol   2 puff Inhalation BID   carvedilol   25 mg Oral BID WC   Chlorhexidine  Gluconate Cloth  6 each Topical Q0600   clopidogrel   75 mg Oral Daily   heparin   5,000 Units Subcutaneous Q8H   insulin  aspart  0-5 Units Subcutaneous QHS   insulin  aspart  0-6 Units Subcutaneous TID WC   pantoprazole   40 mg Oral BID AC   sodium bicarbonate   650 mg Oral BID   sodium chloride  flush  3 mL Intravenous Q12H   sodium zirconium cyclosilicate   10 g Oral BID    have reviewed scheduled and prn medications.  Physical Exam: General:NAD, comfortable Heart:RRR, s1s2 nl Lungs:clear b/l, no crackle Abdomen:soft, Non-tender, non-distended Extremities:No edema Dialysis Access: Right IJ TDC in place  [facility]  Omauri Boeve 08/29/2024,1:05 PM  LOS: 2 days

## 2024-08-29 NOTE — Anesthesia Preprocedure Evaluation (Addendum)
 Anesthesia Evaluation  Patient identified by MRN, date of birth, ID band Patient awake    Reviewed: Allergy & Precautions, NPO status , Patient's Chart, lab work & pertinent test results  History of Anesthesia Complications Negative for: history of anesthetic complications  Airway Mallampati: II  TM Distance: >3 FB Neck ROM: Full    Dental  (+) Dental Advisory Given, Edentulous Upper, Edentulous Lower   Pulmonary COPD (2L/min at all times),  oxygen dependent, Recent URI  (Complicated by COPD chronic cough), Residual Cough, Current Smoker    + wheezing      Cardiovascular hypertension, Pt. on medications and Pt. on home beta blockers + angina (Unstable Angina; None on exam this morning)  + CAD and + Past MI   Rhythm:Regular Rate:Normal  TTE (08/28/24): 1. Left ventricular ejection fraction, by estimation, is 60 to 65%. The  left ventricle has normal function. The left ventricle has no regional  wall motion abnormalities. Left ventricular diastolic parameters are  consistent with Grade I diastolic dysfunction (impaired relaxation). Elevated left atrial pressure.   2. Right ventricular systolic function is normal. The right ventricular  size is mildly enlarged. There is mildly elevated pulmonary artery  systolic pressure. The estimated right ventricular systolic pressure is  37.3 mmHg.   3. Right atrial size was mildly dilated.   4. The mitral valve is degenerative. No evidence of mitral valve  regurgitation. Mild mitral stenosis. The mean mitral valve gradient is 4.5  mmHg. Severe mitral annular calcification.   5. Tricuspid valve regurgitation is mild to moderate.   6. The aortic valve is tricuspid. There is mild calcification of the  aortic valve. Aortic valve regurgitation is not visualized. No aortic  stenosis is present. Aortic valve area, by VTI measures 2.31 cm. Aortic  valve mean gradient measures 7.0 mmHg.  Aortic valve  Vmax measures 1.73 m/s.   7. The inferior vena cava is normal in size with greater than 50%  respiratory variability, suggesting right atrial pressure of 3 mmHg.     Neuro/Psych    GI/Hepatic PUD,,,  Endo/Other  diabetes, Type 2    Renal/GU CRFRenal diseaseAKI on CKD5; Persistent hyperkalemia requiring HD access placement  K 4.6 (08/29/24)     Musculoskeletal   Abdominal   Peds  Hematology  (+) Blood dyscrasia (2/2 chronic disease vs PUD), anemia Hgb 6 on admission s/p 2 units pRBCs;   Hgb 10.5, Plts 166K (08/29/24)     Anesthesia Other Findings   Reproductive/Obstetrics                              Anesthesia Physical Anesthesia Plan  ASA: 4  Anesthesia Plan: MAC   Post-op Pain Management:    Induction: Intravenous  PONV Risk Score and Plan: 1 and Ondansetron  and Treatment may vary due to age or medical condition  Airway Management Planned: Natural Airway and Simple Face Mask  Additional Equipment: Arterial line  Intra-op Plan:   Post-operative Plan: Extubation in OR  Informed Consent:      Dental advisory given  Plan Discussed with: CRNA  Anesthesia Plan Comments:          Anesthesia Quick Evaluation

## 2024-08-29 NOTE — Progress Notes (Signed)
 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  Patient reports back to her facility with ongoing chest pain with profound anemia, AKI on CKD 5 with hyperkalemia.  Hospitalist called for admission, vascular surgery and nephrology called in consult.  Assessment & Plan:   Principal Problem:   Symptomatic anemia Active Problems:   COPD (chronic obstructive pulmonary disease) (HCC)   Hypertension   Chronic heart failure with preserved ejection fraction (HFpEF) (HCC)   Metabolic acidosis   Hyperkalemia   Unstable angina (HCC)   Acute renal failure superimposed on stage 5 chronic kidney disease, not on chronic dialysis (HCC)   Thyroid  mass  AKI on CKD 5, hyperkalemia - Patient persistently hyperkalemic with profoundly elevated creatinine - Nephrology and vascular surgery following, status post temporary dialysis catheter placement 12/11 - Tentative plan for HD per nephrology schedule - Patient reports she still makes urine, follow I's and O's - AV fistula placement versus graft pending further evaluation in the outpatient setting  Acute on chronic anemia - Secondary to CKD, no history of overt bleeding noted - 2 unit PRBC administered at intake, hemoglobin otherwise stabilizing  Chest pain, subacute to chronic, multivessel CAD - Per prior notes has been offered CABG versus high risk PCI at [facility] earlier this year which she declined due to fear of progressive CKD - Echo 12/10 -EF 60 to 65% normal LV function with grade 1 diastolic dysfunction  - Defer to cardiology for ongoing ischemic evaluation once established on HD -  Continue aspirin , Plavix , Coreg  and statin per cardiology   History of PUD - Over a year ago, continue PPI   Chronic diastolic CHF, grade 1 - Repeat echo confirms grade 1 diastolic dysfunction   COPD, chronic hypoxia - Stable, continue home nebs/oxygen at baseline   Type 2 diabetes mellitus - CBG 70-140, continue SSI for now   Thyroid  mass - Noted incidentally, ultrasound notable for goiter - TSH and free T4 unremarkable, recommend biopsy and follow-up as outpatient  DVT prophylaxis: heparin  injection 5,000 Units Start: 08/27/24 0600 Code Status:   Code Status: Full Code Family Communication: At bedside  Status is: Inpatient  Dispo: The patient is from: Home              Anticipated d/c is to: To be determined              Anticipated d/c date is: To be determined              Patient currently not medically stable for discharge  Consultants:  Nephrology, cardiology, vascular surgery  Procedures:  Temporary dialysis catheter and AV fistula placement 12/11  Antimicrobials:  None  Subjective: No acute issues or events overnight, tolerated procedure this morning quite well otherwise denies nausea vomiting diarrhea constipation headache fevers chills or chest pain  Objective: Vitals:   08/29/24 0030 08/29/24 0400 08/29/24 0532 08/29/24 0650  BP: (!) 156/62 (!) 145/56  107/70  Pulse: 65 66  69  Resp: 18 16  18   Temp:  98.5 F (36.9 C)  98.4 F (36.9 C)  TempSrc: Oral Oral  Oral  SpO2: 94% 97%  99%  Weight:   73 kg 73  kg  Height:    5' 2 (1.575 m)    Intake/Output Summary (Last 24 hours) at 08/29/2024 0828 Last data filed at 08/29/2024 0420 Gross per 24 hour  Intake 840 ml  Output 1400 ml  Net -560 ml   Filed Weights   08/28/24 0354 08/29/24 0532 08/29/24 0650  Weight: 72.5 kg 73 kg 73 kg    Examination:  General:  Pleasantly resting in bed, No acute distress. HEENT:  Normocephalic atraumatic.  Sclerae nonicteric, noninjected.  Extraocular movements  intact bilaterally. Neck: Right sided dialysis catheter placement clean dry intact. Lungs:  Clear to auscultate bilaterally without rhonchi, wheeze, or rales. Heart:  Regular rate and rhythm.  Without murmurs, rubs, or gallops. Abdomen:  Soft, nontender, nondistended.  Without guarding or rebound. Extremities: Left upper extremity bandage clean dry intact  Data Reviewed: I have personally reviewed following labs and imaging studies  CBC: Recent Labs  Lab 08/27/24 0204 08/27/24 0355 08/27/24 1748 08/28/24 0241 08/29/24 0252  WBC 5.3 4.7 5.5 5.9 6.0  HGB 6.9* 5.9* 9.9* 9.8* 10.5*  HCT 23.5* 20.1* 32.6* 31.7* 33.3*  MCV 90.7 91.8 88.8 89.3 88.1  PLT 184 165 160 166 166   Basic Metabolic Panel: Recent Labs  Lab 08/27/24 0204 08/27/24 0355 08/27/24 1748 08/28/24 0241 08/28/24 0831 08/29/24 0252  NA 136 141 139 139  --  140  K 5.8* 5.5* 5.4* 6.1* 4.6 4.6  CL 112* 120* 113* 114*  --  109  CO2 15* 10* 18* 19*  --  21*  GLUCOSE 201* 109* 95 90  --  89  BUN 78* 68* 75* 76*  --  70*  CREATININE 6.71* 5.68* 6.52* 6.74*  --  6.03*  CALCIUM  7.5* 6.7* 7.9* 7.8*  --  8.1*   GFR: Estimated Creatinine Clearance: 7.4 mL/min (A) (by C-G formula based on SCr of 6.03 mg/dL (H)). Liver Function Tests: No results for input(s): AST, ALT, ALKPHOS, BILITOT, PROT, ALBUMIN  in the last 168 hours. No results for input(s): LIPASE, AMYLASE in the last 168 hours. No results for input(s): AMMONIA in the last 168 hours. Coagulation Profile: No results for input(s): INR, PROTIME in the last 168 hours. Cardiac Enzymes: No results for input(s): CKTOTAL, CKMB, CKMBINDEX, TROPONINI in the last 168 hours. BNP (last 3 results) No results for input(s): PROBNP in the last 8760 hours. HbA1C: No results for input(s): HGBA1C in the last 72 hours. CBG: Recent Labs  Lab 08/28/24 0556 08/28/24 1152 08/28/24 1657 08/28/24 2118 08/29/24 0557  GLUCAP 145* 105* 96 121* 83    Lipid Profile: No results for input(s): CHOL, HDL, LDLCALC, TRIG, CHOLHDL, LDLDIRECT in the last 72 hours. Thyroid  Function Tests: Recent Labs    08/27/24 0746  TSH 0.657  FREET4 0.63  T3FREE 1.9*   Anemia Panel: Recent Labs    08/28/24 1122  VITAMINB12 363  FOLATE 10.8  FERRITIN 250  TIBC 225*  IRON 71  RETICCTPCT 1.4   Sepsis Labs: No results for input(s): PROCALCITON, LATICACIDVEN in the last 168 hours.  Recent Results (from the past 240 hours)  Surgical PCR screen     Status: None   Collection Time: 08/29/24  5:32 AM   Specimen: Nasal Mucosa; Nasal Swab  Result Value Ref Range Status   MRSA, PCR NEGATIVE NEGATIVE Final   Staphylococcus aureus NEGATIVE NEGATIVE Final    Comment: (NOTE) The Xpert SA Assay (FDA approved for NASAL specimens in patients 12 years of age and older), is one component of a comprehensive  surveillance program. It is not intended to diagnose infection nor to guide or monitor treatment. Performed at [facility], [address] [place], [place] [address]          Radiology Studies: ECHOCARDIOGRAM COMPLETE Result Date: 08/28/2024    ECHOCARDIOGRAM REPORT   Patient Name:   Sabrina Hanson Date of Exam: 08/28/2024 Medical Rec #:  [id number]     Height:       62.0 in Accession #:    [id number]    Weight:       159.8 lb Date of Birth:  1945/12/08    BSA:          1.738 m Patient Age:    77 years      BP:           150/66 mmHg Patient Gender: F             HR:           61 bpm. Exam Location:  Inpatient Procedure: 2D Echo, Cardiac Doppler and Color Doppler (Both Spectral and Color            Flow Doppler were utilized during procedure). Indications:    CHF - Acute Diastolic I50.31  History:        Patient has no prior history of Echocardiogram examinations.                 CHF, Previous Myocardial Infarction and Angina, COPD and CKD,                 stage 5, Signs/Symptoms:Shortness of Breath; Risk                  Factors:Dyslipidemia and Diabetes.  Sonographer:    Thea Norlander RCS Referring Phys: [phone] PREETHA JOSEPH IMPRESSIONS  1. Left ventricular ejection fraction, by estimation, is 60 to 65%. The left ventricle has normal function. The left ventricle has no regional wall motion abnormalities. Left ventricular diastolic parameters are consistent with Grade I diastolic dysfunction (impaired relaxation). Elevated left atrial pressure.  2. Right ventricular systolic function is normal. The right ventricular size is mildly enlarged. There is mildly elevated pulmonary artery systolic pressure. The estimated right ventricular systolic pressure is 37.3 mmHg.  3. Right atrial size was mildly dilated.  4. The mitral valve is degenerative. No evidence of mitral valve regurgitation. Mild mitral stenosis. The mean mitral valve gradient is 4.5 mmHg. Severe mitral annular calcification.  5. Tricuspid valve regurgitation is mild to moderate.  6. The aortic valve is tricuspid. There is mild calcification of the aortic valve. Aortic valve regurgitation is not visualized. No aortic stenosis is present. Aortic valve area, by VTI measures 2.31 cm. Aortic valve mean gradient measures 7.0 mmHg. Aortic valve Vmax measures 1.73 m/s.  7. The inferior vena cava is normal in size with greater than 50% respiratory variability, suggesting right atrial pressure of 3 mmHg. FINDINGS  Left Ventricle: Left ventricular ejection fraction, by estimation, is 60 to 65%. The left ventricle has normal function. The left ventricle has no regional wall motion abnormalities. The left ventricular internal cavity size was normal in size. There is  no left ventricular hypertrophy. Left ventricular diastolic parameters are consistent with Grade I diastolic dysfunction (impaired relaxation). Elevated left atrial pressure. Right Ventricle: The right ventricular size is mildly enlarged. No increase in right ventricular wall thickness. Right ventricular systolic  function is normal. There is mildly elevated pulmonary artery systolic pressure. The tricuspid regurgitant velocity is  2.93 m/s, and with an assumed right atrial pressure of 3 mmHg, the estimated right ventricular systolic pressure is 37.3 mmHg. Left Atrium: Left atrial size was normal in size. Right Atrium: Right atrial size was mildly dilated. Pericardium: There is no evidence of pericardial effusion. Mitral Valve: The mitral valve is degenerative in appearance. Severe mitral annular calcification. No evidence of mitral valve regurgitation. Mild mitral valve stenosis. MV peak gradient, 11.7 mmHg. The mean mitral valve gradient is 4.5 mmHg. Tricuspid Valve: The tricuspid valve is normal in structure. Tricuspid valve regurgitation is mild to moderate. No evidence of tricuspid stenosis. Aortic Valve: The aortic valve is tricuspid. There is mild calcification of the aortic valve. Aortic valve regurgitation is not visualized. No aortic stenosis is present. Aortic valve mean gradient measures 7.0 mmHg. Aortic valve peak gradient measures 12.0 mmHg. Aortic valve area, by VTI measures 2.31 cm. Pulmonic Valve: The pulmonic valve was normal in structure. Pulmonic valve regurgitation is not visualized. No evidence of pulmonic stenosis. Aorta: The aortic root is normal in size and structure. Venous: The inferior vena cava is normal in size with greater than 50% respiratory variability, suggesting right atrial pressure of 3 mmHg. IAS/Shunts: No atrial level shunt detected by color flow Doppler.  LEFT VENTRICLE PLAX 2D LVIDd:         4.80 cm     Diastology LVIDs:         3.20 cm     LV e' medial:    5.70 cm/s LV PW:         1.00 cm     LV E/e' medial:  26.7 LV IVS:        1.10 cm     LV e' lateral:   8.85 cm/s LVOT diam:     1.90 cm     LV E/e' lateral: 17.2 LV SV:         97 LV SV Index:   56 LVOT Area:     2.84 cm LV IVRT:       81 msec  LV Volumes (MOD) LV vol d, MOD A2C: 88.7 ml LV vol d, MOD A4C: 73.2 ml LV vol s, MOD  A2C: 32.2 ml LV vol s, MOD A4C: 25.0 ml LV SV MOD A2C:     56.5 ml LV SV MOD A4C:     73.2 ml LV SV MOD BP:      53.4 ml RIGHT VENTRICLE             IVC RV S prime:     12.10 cm/s  IVC diam: 2.20 cm TAPSE (M-mode): 2.9 cm LEFT ATRIUM             Index        RIGHT ATRIUM           Index LA diam:        3.20 cm 1.84 cm/m   RA Area:     19.10 cm LA Vol (A2C):   51.1 ml 29.40 ml/m  RA Volume:   55.70 ml  32.05 ml/m LA Vol (A4C):   56.1 ml 32.28 ml/m LA Biplane Vol: 54.3 ml 31.24 ml/m  AORTIC VALVE AV Area (Vmax):    2.43 cm AV Area (Vmean):   2.30 cm AV Area (VTI):     2.31 cm AV Vmax:           173.00 cm/s AV Vmean:          121.000 cm/s AV VTI:  0.418 m AV Peak Grad:      12.0 mmHg AV Mean Grad:      7.0 mmHg LVOT Vmax:         148.00 cm/s LVOT Vmean:        98.300 cm/s LVOT VTI:          0.341 m LVOT/AV VTI ratio: 0.82  AORTA Ao Root diam: 2.70 cm Ao Asc diam:  2.90 cm MITRAL VALVE                TRICUSPID VALVE MV Area (PHT): 3.12 cm     TR Peak grad:   34.3 mmHg MV Area VTI:   1.86 cm     TR Vmax:        293.00 cm/s MV Peak grad:  11.7 mmHg MV Mean grad:  4.5 mmHg     SHUNTS MV Vmax:       1.71 m/s     Systemic VTI:  0.34 m MV Vmean:      101.4 cm/s   Systemic Diam: 1.90 cm MV Decel Time: 243 msec MR Peak grad: 63.8 mmHg MR Vmax:      399.50 cm/s MV E velocity: 152.00 cm/s MV A velocity: 178.00 cm/s MV E/A ratio:  0.85 Oneil Parchment MD Electronically signed by Oneil Parchment MD Signature Date/Time: 08/28/2024/5:07:42 PM    Final         Scheduled Meds:  [MAR Hold] sodium chloride    Intravenous Once   [MAR Hold] aspirin  EC  81 mg Oral Daily   [MAR Hold] atorvastatin   80 mg Oral Daily   [MAR Hold] budesonide -glycopyrrolate -formoterol   2 puff Inhalation BID   [MAR Hold] carvedilol   25 mg Oral BID WC   [MAR Hold] clopidogrel   75 mg Oral Daily   [MAR Hold] heparin   5,000 Units Subcutaneous Q8H   [MAR Hold] insulin  aspart  0-5 Units Subcutaneous QHS   [MAR Hold] insulin  aspart  0-6 Units  Subcutaneous TID WC   [MAR Hold] pantoprazole   40 mg Oral BID AC   [MAR Hold] sodium bicarbonate   650 mg Oral BID   [MAR Hold] sodium chloride  flush  3 mL Intravenous Q12H   [MAR Hold] sodium zirconium cyclosilicate   10 g Oral BID   Continuous Infusions:  sodium chloride      [MAR Hold]  ceFAZolin (ANCEF) IV       LOS: 2 days   Time spent:  Elsie JAYSON [place], DO [facility]  If 7PM-7AM, please contact night-coverage [email or web address]  08/29/2024, 8:28 AM

## 2024-08-29 NOTE — Interval H&P Note (Signed)
 History and Physical Interval Note:  08/29/2024 7:23 AM  Sabrina Hanson  has presented today for surgery, with the diagnosis of ESRD.  The various methods of treatment have been discussed with the patient and family. After consideration of risks, benefits and other options for treatment, the patient has consented to    She is a history of unstable angina and is undergoing workup for PCI versus CABG in a different hospital.  I explained to the status today we will plan for tunneled dialysis catheter and hold off on any arm access today in order to limit anesthesia and surgical stress.  She will have follow-up in our office after her cardiac interventions to discuss arm access.   Norman GORMAN Serve

## 2024-08-29 NOTE — Anesthesia Postprocedure Evaluation (Signed)
 Anesthesia Post Note  Patient: Sabrina Hanson  Procedure(s) Performed: RIGHT INSERTION OF DIALYSIS CATHETER (Right: Chest)     Patient location during evaluation: PACU Anesthesia Type: MAC Level of consciousness: awake Pain management: pain level controlled Vital Signs Assessment: post-procedure vital signs reviewed and stable Respiratory status: spontaneous breathing Cardiovascular status: blood pressure returned to baseline Postop Assessment: no apparent nausea or vomiting Anesthetic complications: no   No notable events documented.               Lauraine DASEN Colhoun

## 2024-08-30 ENCOUNTER — Inpatient Hospital Stay (HOSPITAL_COMMUNITY)

## 2024-08-30 ENCOUNTER — Encounter (HOSPITAL_COMMUNITY): Payer: Self-pay | Admitting: Vascular Surgery

## 2024-08-30 DIAGNOSIS — T82838A Hemorrhage of vascular prosthetic devices, implants and grafts, initial encounter: Secondary | ICD-10-CM

## 2024-08-30 LAB — RENAL FUNCTION PANEL
Albumin: 2.8 g/dL — ABNORMAL LOW (ref 3.5–5.0)
Anion gap: 10 (ref 5–15)
BUN: 44 mg/dL — ABNORMAL HIGH (ref 8–23)
CO2: 24 mmol/L (ref 22–32)
Calcium: 7.7 mg/dL — ABNORMAL LOW (ref 8.9–10.3)
Chloride: 103 mmol/L (ref 98–111)
Creatinine, Ser: 4.56 mg/dL — ABNORMAL HIGH (ref 0.44–1.00)
GFR, Estimated: 9 mL/min — ABNORMAL LOW (ref >60)
Glucose, Bld: 101 mg/dL — ABNORMAL HIGH (ref 70–99)
Phosphorus: 4.7 mg/dL — ABNORMAL HIGH (ref 2.5–4.6)
Potassium: 3.7 mmol/L (ref 3.5–5.1)
Sodium: 137 mmol/L (ref 135–145)

## 2024-08-30 LAB — GLUCOSE, CAPILLARY
Glucose-Capillary: 85 mg/dL (ref 70–99)
Glucose-Capillary: 111 mg/dL — ABNORMAL HIGH (ref 70–99)
Glucose-Capillary: 148 mg/dL — ABNORMAL HIGH (ref 70–99)
Glucose-Capillary: 138 mg/dL — ABNORMAL HIGH (ref 70–99)

## 2024-08-30 LAB — HEPATITIS B SURFACE ANTIBODY, QUANTITATIVE: Hep B S AB Quant (Post): 13.8 m[IU]/mL

## 2024-08-30 NOTE — Plan of Care (Signed)
  Problem: Education: Goal: Ability to describe self-care measures that may prevent or decrease complications (Diabetes Survival Skills Education) will improve Outcome: Progressing Goal: Individualized Educational Video(s) Outcome: Progressing   Problem: Coping: Goal: Ability to adjust to condition or change in health will improve Outcome: Progressing   Problem: Fluid Volume: Goal: Ability to maintain a balanced intake and output will improve Outcome: Progressing   Problem: Health Behavior/Discharge Planning: Goal: Ability to identify and utilize available resources and services will improve Outcome: Progressing Goal: Ability to manage health-related needs will improve Outcome: Progressing   Problem: Metabolic: Goal: Ability to maintain appropriate glucose levels will improve Outcome: Progressing   Problem: Nutritional: Goal: Maintenance of adequate nutrition will improve Outcome: Progressing Goal: Progress toward achieving an optimal weight will improve Outcome: Progressing   Problem: Skin Integrity: Goal: Risk for impaired skin integrity will decrease Outcome: Progressing   Problem: Tissue Perfusion: Goal: Adequacy of tissue perfusion will improve Outcome: Progressing   Problem: Education: Goal: Knowledge of General Education information will improve Description: Including pain rating scale, medication(s)/side effects and non-pharmacologic comfort measures Outcome: Progressing   Problem: Health Behavior/Discharge Planning: Goal: Ability to manage health-related needs will improve Outcome: Progressing   Problem: Clinical Measurements: Goal: Ability to maintain clinical measurements within normal limits will improve Outcome: Progressing Goal: Will remain free from infection Outcome: Progressing Goal: Diagnostic test results will improve Outcome: Progressing Goal: Respiratory complications will improve Outcome: Progressing Goal: Cardiovascular complication will  be avoided Outcome: Progressing   Problem: Activity: Goal: Risk for activity intolerance will decrease Outcome: Progressing   Problem: Nutrition: Goal: Adequate nutrition will be maintained Outcome: Progressing   Problem: Coping: Goal: Level of anxiety will decrease Outcome: Progressing   Problem: Elimination: Goal: Will not experience complications related to bowel motility Outcome: Progressing Goal: Will not experience complications related to urinary retention Outcome: Progressing   Problem: Pain Managment: Goal: General experience of comfort will improve and/or be controlled Outcome: Progressing   Problem: Safety: Goal: Ability to remain free from injury will improve Outcome: Progressing   Problem: Skin Integrity: Goal: Risk for impaired skin integrity will decrease Outcome: Progressing   Problem: Education: Goal: Knowledge of disease and its progression will improve Outcome: Progressing Goal: Individualized Educational Video(s) Outcome: Progressing   Problem: Fluid Volume: Goal: Compliance with measures to maintain balanced fluid volume will improve Outcome: Progressing   Problem: Health Behavior/Discharge Planning: Goal: Ability to manage health-related needs will improve Outcome: Progressing   Problem: Nutritional: Goal: Ability to make healthy dietary choices will improve Outcome: Progressing   Problem: Clinical Measurements: Goal: Complications related to the disease process, condition or treatment will be avoided or minimized Outcome: Progressing

## 2024-08-30 NOTE — Progress Notes (Addendum)
 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  Patient reports back to her facility with ongoing chest pain with profound anemia, AKI on CKD 5 with hyperkalemia.  Hospitalist called for admission, vascular surgery and nephrology called in consult.  Assessment & Plan:   Principal Problem:   Symptomatic anemia Active Problems:   COPD (chronic obstructive pulmonary disease) (HCC)   Hypertension   Chronic heart failure with preserved ejection fraction (HFpEF) (HCC)   Metabolic acidosis   Hyperkalemia   Unstable angina (HCC)   Acute renal failure superimposed on stage 5 chronic kidney disease, not on chronic dialysis (HCC)   Thyroid  mass  AKI on CKD 5, hyperkalemia - Patient persistently hyperkalemic with profoundly elevated creatinine - Nephrology and vascular surgery following, status post temporary dialysis catheter placement 12/11 - Tentative plan for HD per nephrology schedule - Patient reports she still makes urine, follow I's and O's - AV fistula placement versus graft pending further evaluation in the outpatient setting - Patient has secured dialysis slot, plan to continue HD here in house over the weekend with discharge to outpatient dialysis on Tuesday, 09/03/2024  Acute on chronic anemia - Secondary to CKD, no history of overt bleeding noted - 2 unit PRBC administered at intake, hemoglobin otherwise stabilizing  Chest pain, subacute to chronic, multivessel CAD - Per prior notes has been offered CABG versus high risk PCI at [facility] earlier this year which she declined due to fear of progressive CKD - Echo 12/10 -EF 60  to 65% normal LV function with grade 1 diastolic dysfunction  - Defer to cardiology for ongoing ischemic evaluation once established on HD - Continue aspirin , Plavix , Coreg  and statin per cardiology   History of PUD - Over a year ago, continue PPI   Chronic diastolic CHF, grade 1 - Repeat echo confirms grade 1 diastolic dysfunction   COPD, chronic hypoxia - Stable, continue home nebs/oxygen at baseline   Type 2 diabetes mellitus - CBG 70-140, continue SSI for now   Thyroid  mass - Noted incidentally, ultrasound notable for goiter - TSH and free T4 unremarkable, recommend biopsy and follow-up as outpatient  DVT prophylaxis: heparin  injection 5,000 Units Start: 08/27/24 0600 Code Status:   Code Status: Full Code Family Communication: At bedside  Status is: Inpatient  Dispo: The patient is from: Home              Anticipated d/c is to: To be determined              Anticipated d/c date is: To be determined              Patient currently not medically stable for discharge  Consultants:  Nephrology, cardiology, vascular surgery  Procedures:  Temporary dialysis catheter and AV fistula placement 12/11  Antimicrobials:  None  Subjective: No acute issues or events overnight, tolerated dialysis yesterday afternoon quite well, otherwise feels markedly improved from previous denies nausea vomiting diarrhea constipation headache fever chills or chest pain  Objective: Vitals:   08/29/24 2050 08/30/24 0010 08/30/24 0306 08/30/24 0730  BP: (!) 148/55 (!) 157/80 (!) 162/57 (!) 145/46  Pulse: (!) 59 62 70 60  Resp: 17 16 20  17  Temp: 98.5 F (36.9 C) 98 F (36.7 C) 99.3 F (37.4 C) 97.7 F (36.5 C)  TempSrc: Oral Oral Oral Oral  SpO2: 100% 97% 93% 91%  Weight:   71.7 kg   Height:        Intake/Output Summary (Last 24 hours) at 08/30/2024 0811 Last data filed at 08/30/2024 0630 Gross per 24 hour  Intake 390 ml  Output 615 ml  Net -225 ml   Filed Weights   08/29/24 1519  08/29/24 1748 08/30/24 0306  Weight: 75.1 kg 75 kg 71.7 kg    Examination:  General:  Pleasantly resting in bed, No acute distress. HEENT:  Normocephalic atraumatic.  Sclerae nonicteric, noninjected.  Extraocular movements intact bilaterally. Neck: Right sided dialysis catheter placement clean dry intact. Lungs:  Clear to auscultate bilaterally without rhonchi, wheeze, or rales. Heart:  Regular rate and rhythm.  Without murmurs, rubs, or gallops. Abdomen:  Soft, nontender, nondistended.  Without guarding or rebound. Extremities: Left upper extremity bandage clean dry intact  Data Reviewed: I have personally reviewed following labs and imaging studies  CBC: Recent Labs  Lab 08/27/24 0204 08/27/24 0355 08/27/24 1748 08/28/24 0241 08/29/24 0252  WBC 5.3 4.7 5.5 5.9 6.0  HGB 6.9* 5.9* 9.9* 9.8* 10.5*  HCT 23.5* 20.1* 32.6* 31.7* 33.3*  MCV 90.7 91.8 88.8 89.3 88.1  PLT 184 165 160 166 166   Basic Metabolic Panel: Recent Labs  Lab 08/27/24 0355 08/27/24 1748 08/28/24 0241 08/28/24 0831 08/29/24 0252 08/30/24 0223  NA 141 139 139  --  140 137  K 5.5* 5.4* 6.1* 4.6 4.6 3.7  CL 120* 113* 114*  --  109 103  CO2 10* 18* 19*  --  21* 24  GLUCOSE 109* 95 90  --  89 101*  BUN 68* 75* 76*  --  70* 44*  CREATININE 5.68* 6.52* 6.74*  --  6.03* 4.56*  CALCIUM  6.7* 7.9* 7.8*  --  8.1* 7.7*  PHOS  --   --   --   --   --  4.7*   GFR: Estimated Creatinine Clearance: 9.7 mL/min (A) (by C-G formula based on SCr of 4.56 mg/dL (H)). Liver Function Tests: Recent Labs  Lab 08/30/24 0223  ALBUMIN  2.8*   No results for input(s): LIPASE, AMYLASE in the last 168 hours. No results for input(s): AMMONIA in the last 168 hours. Coagulation Profile: No results for input(s): INR, PROTIME in the last 168 hours. Cardiac Enzymes: No results for input(s): CKTOTAL, CKMB, CKMBINDEX, TROPONINI in the last 168 hours. BNP (last 3 results) No results for input(s): PROBNP in the  last 8760 hours. HbA1C: No results for input(s): HGBA1C in the last 72 hours. CBG: Recent Labs  Lab 08/29/24 0910 08/29/24 1148 08/29/24 1827 08/29/24 2052 08/30/24 0625  GLUCAP 109* 160* 85 133* 85   Lipid Profile: No results for input(s): CHOL, HDL, LDLCALC, TRIG, CHOLHDL, LDLDIRECT in the last 72 hours. Thyroid  Function Tests: No results for input(s): TSH, T4TOTAL, FREET4, T3FREE, THYROIDAB in the last 72 hours.  Anemia Panel: Recent Labs    08/28/24 1122  VITAMINB12 363  FOLATE 10.8  FERRITIN 250  TIBC 225*  IRON 71  RETICCTPCT 1.4   Sepsis Labs: No results for input(s): PROCALCITON, LATICACIDVEN in the last 168 hours.  Recent Results (from the past 240 hours)  Surgical PCR screen     Status: None   Collection Time: 08/29/24  5:32 AM   Specimen: Nasal Mucosa; Nasal Swab  Result Value  Ref Range Status   MRSA, PCR NEGATIVE NEGATIVE Final   Staphylococcus aureus NEGATIVE NEGATIVE Final    Comment: (NOTE) The Xpert SA Assay (FDA approved for NASAL specimens in patients 72 years of age and older), is one component of a comprehensive surveillance program. It is not intended to diagnose infection nor to guide or monitor treatment. Performed at [facility], [address], [place], [place] [address]          Radiology Studies: DG Chest Port 1 View Result Date: 08/29/2024 EXAM: XR Chest, 1 View(s). CLINICAL HISTORY: 78 year old female. Status post dialysis catheter insertion. COMPARISON: 08/27/2024. Thyroid  ultrasound 08/27/2024. FINDINGS: LUNGS: Mild linear subsegmental atelectasis or scarring in left mid lung. PLEURAL SPACES: No pleural effusion or pneumothorax. HEART: The heart size is normal. Dense aortic arch calcifications. 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 No acute osseous abnormality. LINES AND TUBES: Right IJ dual lumen central venous catheter in place  with tip at cavoatrial junction. IMPRESSION: 1. Right internal jugular dual-lumen central venous catheter with tip at the cavoatrial junction. 2. Mild linear atelectasis or scarring in the left mid lung. 3. No other acute cardiopulmonary abnormality. 4. 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 Electronically signed by: Helayne Hurst MD 08/29/2024 09:43 AM EST RP Workstation: [id number]   DG C-Arm 1-60 Min-No Report Result Date: 08/29/2024 Fluoroscopy was utilized by the requesting physician.  No radiographic interpretation.   ECHOCARDIOGRAM COMPLETE Result Date: 08/28/2024    ECHOCARDIOGRAM REPORT   Patient Name:   Sabrina Hanson Date of Exam: 08/28/2024 Medical Rec #:  [id number]     Height:       62.0 in Accession #:    [id number]    Weight:       159.8 lb Date of Birth:  1945/12/05    BSA:          1.738 m Patient Age:    77 years      BP:           150/66 mmHg Patient Gender: F             HR:           61 bpm. Exam Location:  Inpatient Procedure: 2D Echo, Cardiac Doppler and Color Doppler (Both Spectral and Color            Flow Doppler were utilized during procedure). Indications:    CHF - Acute Diastolic I50.31  History:        Patient has no prior history of Echocardiogram examinations.                 CHF, Previous Myocardial Infarction and Angina, COPD and CKD,                 stage 5, Signs/Symptoms:Shortness of Breath; Risk                 Factors:Dyslipidemia and Diabetes.  Sonographer:    Thea Norlander RCS Referring Phys: [phone] PREETHA JOSEPH IMPRESSIONS  1. Left ventricular ejection fraction, by estimation, is 60 to 65%. The left ventricle has normal function. The left ventricle has no regional wall motion abnormalities. Left ventricular diastolic parameters are consistent with Grade I diastolic dysfunction (impaired relaxation). Elevated left atrial pressure.  2. Right ventricular systolic function is normal. The right ventricular size is mildly  enlarged. There is mildly elevated pulmonary artery systolic pressure. The estimated  right ventricular systolic pressure is 37.3 mmHg.  3. Right atrial size was mildly dilated.  4. The mitral valve is degenerative. No evidence of mitral valve regurgitation. Mild mitral stenosis. The mean mitral valve gradient is 4.5 mmHg. Severe mitral annular calcification.  5. Tricuspid valve regurgitation is mild to moderate.  6. The aortic valve is tricuspid. There is mild calcification of the aortic valve. Aortic valve regurgitation is not visualized. No aortic stenosis is present. Aortic valve area, by VTI measures 2.31 cm. Aortic valve mean gradient measures 7.0 mmHg. Aortic valve Vmax measures 1.73 m/s.  7. The inferior vena cava is normal in size with greater than 50% respiratory variability, suggesting right atrial pressure of 3 mmHg. FINDINGS  Left Ventricle: Left ventricular ejection fraction, by estimation, is 60 to 65%. The left ventricle has normal function. The left ventricle has no regional wall motion abnormalities. The left ventricular internal cavity size was normal in size. There is  no left ventricular hypertrophy. Left ventricular diastolic parameters are consistent with Grade I diastolic dysfunction (impaired relaxation). Elevated left atrial pressure. Right Ventricle: The right ventricular size is mildly enlarged. No increase in right ventricular wall thickness. Right ventricular systolic function is normal. There is mildly elevated pulmonary artery systolic pressure. The tricuspid regurgitant velocity is 2.93 m/s, and with an assumed right atrial pressure of 3 mmHg, the estimated right ventricular systolic pressure is 37.3 mmHg. Left Atrium: Left atrial size was normal in size. Right Atrium: Right atrial size was mildly dilated. Pericardium: There is no evidence of pericardial effusion. Mitral Valve: The mitral valve is degenerative in appearance. Severe mitral annular calcification. No evidence of mitral  valve regurgitation. Mild mitral valve stenosis. MV peak gradient, 11.7 mmHg. The mean mitral valve gradient is 4.5 mmHg. Tricuspid Valve: The tricuspid valve is normal in structure. Tricuspid valve regurgitation is mild to moderate. No evidence of tricuspid stenosis. Aortic Valve: The aortic valve is tricuspid. There is mild calcification of the aortic valve. Aortic valve regurgitation is not visualized. No aortic stenosis is present. Aortic valve mean gradient measures 7.0 mmHg. Aortic valve peak gradient measures 12.0 mmHg. Aortic valve area, by VTI measures 2.31 cm. Pulmonic Valve: The pulmonic valve was normal in structure. Pulmonic valve regurgitation is not visualized. No evidence of pulmonic stenosis. Aorta: The aortic root is normal in size and structure. Venous: The inferior vena cava is normal in size with greater than 50% respiratory variability, suggesting right atrial pressure of 3 mmHg. IAS/Shunts: No atrial level shunt detected by color flow Doppler.  LEFT VENTRICLE PLAX 2D LVIDd:         4.80 cm     Diastology LVIDs:         3.20 cm     LV e' medial:    5.70 cm/s LV PW:         1.00 cm     LV E/e' medial:  26.7 LV IVS:        1.10 cm     LV e' lateral:   8.85 cm/s LVOT diam:     1.90 cm     LV E/e' lateral: 17.2 LV SV:         97 LV SV Index:   56 LVOT Area:     2.84 cm LV IVRT:       81 msec  LV Volumes (MOD) LV vol d, MOD A2C: 88.7 ml LV vol d, MOD A4C: 73.2 ml LV vol s, MOD A2C: 32.2 ml LV vol s,  MOD A4C: 25.0 ml LV SV MOD A2C:     56.5 ml LV SV MOD A4C:     73.2 ml LV SV MOD BP:      53.4 ml RIGHT VENTRICLE             IVC RV S prime:     12.10 cm/s  IVC diam: 2.20 cm TAPSE (M-mode): 2.9 cm LEFT ATRIUM             Index        RIGHT ATRIUM           Index LA diam:        3.20 cm 1.84 cm/m   RA Area:     19.10 cm LA Vol (A2C):   51.1 ml 29.40 ml/m  RA Volume:   55.70 ml  32.05 ml/m LA Vol (A4C):   56.1 ml 32.28 ml/m LA Biplane Vol: 54.3 ml 31.24 ml/m  AORTIC VALVE AV Area (Vmax):    2.43  cm AV Area (Vmean):   2.30 cm AV Area (VTI):     2.31 cm AV Vmax:           173.00 cm/s AV Vmean:          121.000 cm/s AV VTI:            0.418 m AV Peak Grad:      12.0 mmHg AV Mean Grad:      7.0 mmHg LVOT Vmax:         148.00 cm/s LVOT Vmean:        98.300 cm/s LVOT VTI:          0.341 m LVOT/AV VTI ratio: 0.82  AORTA Ao Root diam: 2.70 cm Ao Asc diam:  2.90 cm MITRAL VALVE                TRICUSPID VALVE MV Area (PHT): 3.12 cm     TR Peak grad:   34.3 mmHg MV Area VTI:   1.86 cm     TR Vmax:        293.00 cm/s MV Peak grad:  11.7 mmHg MV Mean grad:  4.5 mmHg     SHUNTS MV Vmax:       1.71 m/s     Systemic VTI:  0.34 m MV Vmean:      101.4 cm/s   Systemic Diam: 1.90 cm MV Decel Time: 243 msec MR Peak grad: 63.8 mmHg MR Vmax:      399.50 cm/s MV E velocity: 152.00 cm/s MV A velocity: 178.00 cm/s MV E/A ratio:  0.85 Oneil Parchment MD Electronically signed by Oneil Parchment MD Signature Date/Time: 08/28/2024/5:07:42 PM    Final         Scheduled Meds:  sodium chloride    Intravenous Once   aspirin  EC  81 mg Oral Daily   atorvastatin   80 mg Oral Daily   budesonide -glycopyrrolate -formoterol   2 puff Inhalation BID   carvedilol   25 mg Oral BID WC   Chlorhexidine  Gluconate Cloth  6 each Topical Q0600   clopidogrel   75 mg Oral Daily   heparin   5,000 Units Subcutaneous Q8H   insulin  aspart  0-5 Units Subcutaneous QHS   insulin  aspart  0-6 Units Subcutaneous TID WC   pantoprazole   40 mg Oral BID AC   sodium chloride  flush  3 mL Intravenous Q12H   Continuous Infusions:     LOS: 3 days   Time spent:  Elsie JAYSON [place], DO [facility]  If 7PM-7AM, please contact night-coverage [email or web address]  08/30/2024, 8:11 AM

## 2024-08-30 NOTE — Progress Notes (Signed)
 Pt has been accepted at [facility] on TTS 11:45 am chair time. Pt can start on Tuesday, Dec 16. Pt will need to arrive at 11:00 am to complete paperwork prior to treatment. Met with pt at bedside to discuss above arrangements. Pt agreeable to plans and schedule letter provided as well. Pt agreeable to navigator calling pt's son to discuss details as well. Spoke to pt's son, [name], via phone. HD arrangements added to AVS. Update provided to attending, nephrologist, RN CM, and pt's RN. Also contacted renal PA to be advised of clinic's need for orders at d/c. Will assist as needed.   Randine Mungo Dialysis Navigator [phone]

## 2024-08-30 NOTE — Care Management Important Message (Signed)
 Important Message  Patient Details  Name: Sabrina Hanson MRN: [id number] Date of Birth: 1946/07/02   Important Message Given:  Yes - Medicare IM     Claretta Deed 08/30/2024, 2:35 PM

## 2024-08-30 NOTE — Progress Notes (Signed)
 [facility] NEPHROLOGY PROGRESS NOTE  Assessment/ Plan: Pt is a 78 y.o. yo female with past medical history significant for HTN, HLD, type II DM, COPD, advanced CKD was presented with chest pain, hyperkalemia and worsening renal failure.  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   - [facility] 08/1124 by VVS- AVF when she has cardiac workup complete - HD #2 tomorrow - CLIP in process  # Hyperkalemia: Status post medical management now starting dialysis.  Discontinue Lokelma .  Follow lab.  # Hypertension/volume: UF with HD, currently on carvedilol .  # Anemia of CKD: Iron saturation 32% and hemoglobin improved to 10.5.  Monitor hemoglobin and assess for erythropoietin need.  # CKD-MBD: Check PTH level.  Monitor calcium , phosphorus level.  Subjective: Seen and examined at bedside.  Had a little bleeding from [facility] last night.  No hematoma.  Did well with HD yesterday, plan again for tomorrow.     Objective Vital signs in last 24 hours: Vitals:   08/30/24 0010 08/30/24 0306 08/30/24 0730 08/30/24 0820  BP: (!) 157/80 (!) 162/57 (!) 145/46   Pulse: 62 70 60 (!) 59  Resp: 16 20 17 19   Temp: 98 F (36.7 C) 99.3 F (37.4 C) 97.7 F (36.5 C)   TempSrc: Oral Oral Oral   SpO2: 97% 93% 91% 99%  Weight:  71.7 kg    Height:       Weight change: 2.1 kg  Intake/Output Summary (Last 24 hours) at 08/30/2024 1044 Last data filed at 08/30/2024 1036 Gross per 24 hour  Intake 243 ml  Output 800 ml  Net -557 ml       Labs: RENAL PANEL Recent Labs  Lab 08/27/24 0355 08/27/24 1748 08/28/24 0241 08/28/24 0831 08/29/24 0252 08/30/24 0223  NA 141 139 139  --  140 137  K 5.5* 5.4* 6.1* 4.6 4.6 3.7  CL 120* 113* 114*  --  109 103  CO2 10* 18* 19*  --  21* 24  GLUCOSE 109* 95 90  --  89 101*  BUN 68* 75* 76*  --  70* 44*  CREATININE 5.68* 6.52* 6.74*  --  6.03* 4.56*  CALCIUM  6.7* 7.9* 7.8*  --  8.1*  7.7*  PHOS  --   --   --   --   --  4.7*  ALBUMIN   --   --   --   --   --  2.8*    Liver Function Tests: Recent Labs  Lab 08/30/24 0223  ALBUMIN  2.8*   No results for input(s): LIPASE, AMYLASE in the last 168 hours. No results for input(s): AMMONIA in the last 168 hours. CBC: Recent Labs    08/27/24 0204 08/27/24 0355 08/27/24 1748 08/28/24 0241 08/28/24 1122 08/29/24 0252  HGB 6.9* 5.9* 9.9* 9.8*  --  10.5*  MCV 90.7 91.8 88.8 89.3  --  88.1  VITAMINB12  --   --   --   --  363  --   FOLATE  --   --   --   --  10.8  --   FERRITIN  --   --   --   --  250  --   TIBC  --   --   --   --  225*  --   IRON  --   --   --   --  71  --   RETICCTPCT  --   --   --   --  1.4  --     Cardiac Enzymes: No results for input(s): CKTOTAL, CKMB, CKMBINDEX, TROPONINI in the last 168 hours. CBG: Recent Labs  Lab 08/29/24 0910 08/29/24 1148 08/29/24 1827 08/29/24 2052 08/30/24 0625  GLUCAP 109* 160* 85 133* 85    Iron Studies:  Recent Labs    08/28/24 1122  IRON 71  TIBC 225*  FERRITIN 250   Studies/Results: DG Chest Port 1 View Result Date: 08/29/2024 EXAM: XR Chest, 1 View(s). CLINICAL HISTORY: 78 year old female. Status post dialysis catheter insertion. COMPARISON: 08/27/2024. Thyroid  ultrasound 08/27/2024. FINDINGS: LUNGS: Mild linear subsegmental atelectasis or scarring in left mid lung. PLEURAL SPACES: No pleural effusion or pneumothorax. HEART: The heart size is normal. Dense aortic arch calcifications. 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 No acute osseous abnormality. LINES AND TUBES: Right IJ dual lumen central venous catheter in place with tip at cavoatrial junction. IMPRESSION: 1. Right internal jugular dual-lumen central venous catheter with tip at the cavoatrial junction. 2. Mild linear atelectasis or scarring in the left mid lung. 3. No other acute cardiopulmonary abnormality. 4. Stable  leftward tracheal deviation at the thoracic inlet related to known thyroid  goiter. Electronically signed by: Helayne Hurst MD 08/29/2024 09:43 AM EST RP Workstation: [id number]   DG C-Arm 1-60 Min-No Report Result Date: 08/29/2024 Fluoroscopy was utilized by the requesting physician.  No radiographic interpretation.   ECHOCARDIOGRAM COMPLETE Result Date: 08/28/2024    ECHOCARDIOGRAM REPORT   Patient Name:   Sabrina Hanson Date of Exam: 08/28/2024 Medical Rec #:  [id number]     Height:       62.0 in Accession #:    [id number]    Weight:       159.8 lb Date of Birth:  [birth year]    BSA:          1.738 m Patient Age:    77 years      BP:           150/66 mmHg Patient Gender: F             HR:           61 bpm. Exam Location:  Inpatient Procedure: 2D Echo, Cardiac Doppler and Color Doppler (Both Spectral and Color            Flow Doppler were utilized during procedure). Indications:    CHF - Acute Diastolic I50.31  History:        Patient has no prior history of Echocardiogram examinations.                 CHF, Previous Myocardial Infarction and Angina, COPD and CKD,                 stage 5, Signs/Symptoms:Shortness of Breath; Risk                 Factors:Dyslipidemia and Diabetes.  Sonographer:    Thea Norlander RCS Referring Phys: [phone] PREETHA JOSEPH IMPRESSIONS  1. Left ventricular ejection fraction, by estimation, is 60 to 65%. The left ventricle has normal function. The left ventricle has no regional wall motion abnormalities. Left ventricular diastolic parameters are consistent with Grade I diastolic dysfunction (impaired relaxation). Elevated left atrial pressure.  2. Right ventricular systolic function is normal. The right ventricular size is mildly enlarged. There is mildly elevated pulmonary artery systolic pressure. The estimated right ventricular systolic pressure is 37.3 mmHg.  3. Right atrial size was mildly dilated.  4. The mitral valve is degenerative. No evidence of mitral valve regurgitation.  Mild mitral stenosis. The mean mitral valve gradient is 4.5 mmHg. Severe mitral annular calcification.  5. Tricuspid valve regurgitation is mild to moderate.  6. The aortic valve is tricuspid. There is mild calcification of the aortic valve. Aortic valve regurgitation is not visualized. No aortic stenosis is present. Aortic valve area, by VTI measures 2.31 cm. Aortic valve mean gradient measures 7.0 mmHg. Aortic valve Vmax measures 1.73 m/s.  7. The inferior vena cava is normal in size with greater than 50% respiratory variability, suggesting right atrial pressure of 3 mmHg. FINDINGS  Left Ventricle: Left ventricular ejection fraction, by estimation, is 60 to 65%. The left ventricle has normal function. The left ventricle has no regional wall motion abnormalities. The left ventricular internal cavity size was normal in size. There is  no left ventricular hypertrophy. Left ventricular diastolic parameters are consistent with Grade I diastolic dysfunction (impaired relaxation). Elevated left atrial pressure. Right Ventricle: The right ventricular size is mildly enlarged. No increase in right ventricular wall thickness. Right ventricular systolic function is normal. There is mildly elevated pulmonary artery systolic pressure. The tricuspid regurgitant velocity is 2.93 m/s, and with an assumed right atrial pressure of 3 mmHg, the estimated right ventricular systolic pressure is 37.3 mmHg. Left Atrium: Left atrial size was normal in size. Right Atrium: Right atrial size was mildly dilated. Pericardium: There is no evidence of pericardial effusion. Mitral Valve: The mitral valve is degenerative in appearance. Severe mitral annular calcification. No evidence of mitral valve regurgitation. Mild mitral valve stenosis. MV peak gradient, 11.7 mmHg. The mean mitral valve gradient is 4.5 mmHg. Tricuspid Valve: The tricuspid valve is normal in structure. Tricuspid valve regurgitation is mild to moderate. No evidence of tricuspid  stenosis. Aortic Valve: The aortic valve is tricuspid. There is mild calcification of the aortic valve. Aortic valve regurgitation is not visualized. No aortic stenosis is present. Aortic valve mean gradient measures 7.0 mmHg. Aortic valve peak gradient measures 12.0 mmHg. Aortic valve area, by VTI measures 2.31 cm. Pulmonic Valve: The pulmonic valve was normal in structure. Pulmonic valve regurgitation is not visualized. No evidence of pulmonic stenosis. Aorta: The aortic root is normal in size and structure. Venous: The inferior vena cava is normal in size with greater than 50% respiratory variability, suggesting right atrial pressure of 3 mmHg. IAS/Shunts: No atrial level shunt detected by color flow Doppler.  LEFT VENTRICLE PLAX 2D LVIDd:         4.80 cm     Diastology LVIDs:         3.20 cm     LV e' medial:    5.70 cm/s LV PW:         1.00 cm     LV E/e' medial:  26.7 LV IVS:        1.10 cm     LV e' lateral:   8.85 cm/s LVOT diam:     1.90 cm     LV E/e' lateral: 17.2 LV SV:         97 LV SV Index:   56 LVOT Area:     2.84 cm LV IVRT:       81 msec  LV Volumes (MOD) LV vol d, MOD A2C: 88.7 ml LV vol d, MOD A4C: 73.2 ml LV vol s, MOD A2C: 32.2 ml LV vol s, MOD A4C: 25.0 ml LV SV MOD A2C:     56.5 ml LV  SV MOD A4C:     73.2 ml LV SV MOD BP:      53.4 ml RIGHT VENTRICLE             IVC RV S prime:     12.10 cm/s  IVC diam: 2.20 cm TAPSE (M-mode): 2.9 cm LEFT ATRIUM             Index        RIGHT ATRIUM           Index LA diam:        3.20 cm 1.84 cm/m   RA Area:     19.10 cm LA Vol (A2C):   51.1 ml 29.40 ml/m  RA Volume:   55.70 ml  32.05 ml/m LA Vol (A4C):   56.1 ml 32.28 ml/m LA Biplane Vol: 54.3 ml 31.24 ml/m  AORTIC VALVE AV Area (Vmax):    2.43 cm AV Area (Vmean):   2.30 cm AV Area (VTI):     2.31 cm AV Vmax:           173.00 cm/s AV Vmean:          121.000 cm/s AV VTI:            0.418 m AV Peak Grad:      12.0 mmHg AV Mean Grad:      7.0 mmHg LVOT Vmax:         148.00 cm/s LVOT Vmean:         98.300 cm/s LVOT VTI:          0.341 m LVOT/AV VTI ratio: 0.82  AORTA Ao Root diam: 2.70 cm Ao Asc diam:  2.90 cm MITRAL VALVE                TRICUSPID VALVE MV Area (PHT): 3.12 cm     TR Peak grad:   34.3 mmHg MV Area VTI:   1.86 cm     TR Vmax:        293.00 cm/s MV Peak grad:  11.7 mmHg MV Mean grad:  4.5 mmHg     SHUNTS MV Vmax:       1.71 m/s     Systemic VTI:  0.34 m MV Vmean:      101.4 cm/s   Systemic Diam: 1.90 cm MV Decel Time: 243 msec MR Peak grad: 63.8 mmHg MR Vmax:      399.50 cm/s MV E velocity: 152.00 cm/s MV A velocity: 178.00 cm/s MV E/A ratio:  0.85 Sabrina Parchment MD Electronically signed by Sabrina Parchment MD Signature Date/Time: 08/28/2024/5:07:42 PM    Final     Medications: Infusions:   Scheduled Medications:  sodium chloride    Intravenous Once   aspirin  EC  81 mg Oral Daily   atorvastatin   80 mg Oral Daily   budesonide -glycopyrrolate -formoterol   2 puff Inhalation BID   carvedilol   25 mg Oral BID WC   Chlorhexidine  Gluconate Cloth  6 each Topical Q0600   clopidogrel   75 mg Oral Daily   heparin   5,000 Units Subcutaneous Q8H   insulin  aspart  0-5 Units Subcutaneous QHS   insulin  aspart  0-6 Units Subcutaneous TID WC   pantoprazole   40 mg Oral BID AC   sodium chloride  flush  3 mL Intravenous Q12H    have reviewed scheduled and prn medications.  Physical Exam: General:NAD, comfortable Heart:RRR, s1s2 nl Lungs:clear b/l, no crackle Abdomen:soft, Non-tender, non-distended Extremities:No edema Dialysis Access: Right IJ TDC in place  Sabrina Hanson 08/30/2024,10:44  AM  LOS: 3 days

## 2024-08-30 NOTE — Progress Notes (Signed)
 Mobility Specialist Progress Note:    08/30/24 1441  Mobility  Activity Ambulated with assistance  Level of Assistance Standby assist, set-up cues, supervision of patient - no hands on  Assistive Device None  Distance Ambulated (ft) 15 ft  Range of Motion/Exercises Active  Activity Response Tolerated well  Mobility Referral Yes  Mobility visit 1 Mobility  Mobility Specialist Start Time (ACUTE ONLY) 1441  Mobility Specialist Stop Time (ACUTE ONLY) 1450  Mobility Specialist Time Calculation (min) (ACUTE ONLY) 9 min   Received pt sitting in recliner agreeable to session. Pt able to move and ambulate well w/ no c/o pt able to stand w/o assist. Pt ambulate to bathroom before returned back to bed. All needs have been met.   Venetia Keel Mobility Specialist Please Neurosurgeon or Rehab Office at [phone]

## 2024-08-30 NOTE — Progress Notes (Addendum)
°  Progress Note    08/30/2024 8:19 AM 1 Day Post-Op  Subjective:  some bleeding from R internal jugular TDC overnight.   Vitals:   08/30/24 0306 08/30/24 0730  BP: (!) 162/57 (!) 145/46  Pulse: 70 60  Resp: 20 17  Temp: 99.3 F (37.4 C) 97.7 F (36.5 C)  SpO2: 93% 91%   Physical Exam: Lungs:  non labored Incisions:  R neck TDC without firm hematoma, no further bleeding noted Neurologic: a&O  CBC    Component Value Date/Time   WBC 6.0 08/29/2024 0252   RBC 3.78 (L) 08/29/2024 0252   HGB 10.5 (L) 08/29/2024 0252   HCT 33.3 (L) 08/29/2024 0252   PLT 166 08/29/2024 0252   MCV 88.1 08/29/2024 0252   MCH 27.8 08/29/2024 0252   MCHC 31.5 08/29/2024 0252   RDW 15.5 08/29/2024 0252   LYMPHSABS 0.3 (L) 09/06/2021 0400   MONOABS 0.1 09/06/2021 0400   EOSABS 0.0 09/06/2021 0400   BASOSABS 0.0 09/06/2021 0400    BMET    Component Value Date/Time   NA 137 08/30/2024 0223   NA 141 10/28/2015 1429   K 3.7 08/30/2024 0223   CL 103 08/30/2024 0223   CO2 24 08/30/2024 0223   GLUCOSE 101 (H) 08/30/2024 0223   BUN 44 (H) 08/30/2024 0223   BUN 22 10/28/2015 1429   CREATININE 4.56 (H) 08/30/2024 0223   CALCIUM  7.7 (L) 08/30/2024 0223   GFRNONAA 9 (L) 08/30/2024 0223   GFRAA 42 (L) 10/28/2015 1429    INR    Component Value Date/Time   INR 1.0 09/06/2021 0400     Intake/Output Summary (Last 24 hours) at 08/30/2024 0819 Last data filed at 08/30/2024 0630 Gross per 24 hour  Intake 390 ml  Output 615 ml  Net -225 ml     Assessment/Plan:  78 y.o. female is s/p R internal jugular TDC 1 Day Post-Op   TDC without firm hematoma or further bleeding on exam.  Okay to use today.  We will defer permanent access placement until she has had her coronary revascularization.  She can be referred back to our office as an outpatient.  Vascular surgery will sign off.  Call with questions.   Donnice Sender, PA-C [facility] [phone] 08/30/2024 8:19  AM   I have seen and evaluated the patient. I agree with the PA note as documented above.  Underwent TDC placement yesterday by Dr. Pearline.  Some oozing around the catheter overnight but this is hemostatic this morning.  Can use catheter today.  She cannot go to the OR for access placement until her cardiac issues have been sorted out as we discussed.  Lonni DOROTHA Gaskins, MD [facility] [place] Office: [phone]

## 2024-08-30 NOTE — Progress Notes (Signed)
 Notified by primary RN of ongoing and worsening oozing at site of recently inserted HD catheter. Consulted with rapid response; advised to remove tegaderm dressing being careful not to dislodge any clots in place and replace with a pressure dressing. The above was done by this RN and primary RN.

## 2024-08-30 NOTE — Care Management Important Message (Signed)
 Important Message  Patient Details  Name: Sabrina Hanson MRN: [id number] Date of Birth: 06-30-1946   Important Message Given:        Claretta Deed 08/30/2024, 2:35 PM

## 2024-08-30 NOTE — Progress Notes (Signed)
 Nutrition Education Note  RD consulted for Renal Education. Provided Nutrition For Dialysis and Grocery Guide for Kidney Disease handouts to patient.   Explained why diet restrictions are needed and provided lists of foods to limit/avoid that are high potassium, sodium, and phosphorus. Provided specific recommendations on safer alternatives of these foods. Strongly encouraged compliance of this diet.   Discussed importance of protein intake at each meal and snack. Provided examples of how to maximize protein intake throughout the day. Discussed need for fluid restriction with dialysis, importance of minimizing weight gain between HD treatments, and renal-friendly beverage options.  Encouraged pt to discuss specific diet questions/concerns with RD at HD outpatient facility. Teach back method used.  Expect good compliance.  Body mass index is 28.91 kg/m. Pt meets criteria for overweight based on current BMI.  Current diet order is renal with 1200 ml fluid restriction, patient is consuming approximately 50-90% of meals at this time. Labs and medications reviewed. No further nutrition interventions warranted at this time. RD contact information provided. If additional nutrition issues arise, please re-consult RD.  Suzen HUNT RD, LDN, CNSC Contact via secure chat. If unavailable, use group chat RD Inpatient.

## 2024-08-31 ENCOUNTER — Other Ambulatory Visit (HOSPITAL_COMMUNITY): Payer: Self-pay

## 2024-08-31 LAB — BASIC METABOLIC PANEL WITH GFR
Anion gap: 7 (ref 5–15)
BUN: 46 mg/dL — ABNORMAL HIGH (ref 8–23)
CO2: 27 mmol/L (ref 22–32)
Calcium: 7.8 mg/dL — ABNORMAL LOW (ref 8.9–10.3)
Chloride: 106 mmol/L (ref 98–111)
Creatinine, Ser: 4.99 mg/dL — ABNORMAL HIGH (ref 0.44–1.00)
GFR, Estimated: 8 mL/min — ABNORMAL LOW (ref >60)
Glucose, Bld: 96 mg/dL (ref 70–99)
Potassium: 3.5 mmol/L (ref 3.5–5.1)
Sodium: 140 mmol/L (ref 135–145)

## 2024-08-31 LAB — GLUCOSE, CAPILLARY
Glucose-Capillary: 93 mg/dL (ref 70–99)
Glucose-Capillary: 94 mg/dL (ref 70–99)
Glucose-Capillary: 103 mg/dL — ABNORMAL HIGH (ref 70–99)
Glucose-Capillary: 203 mg/dL — ABNORMAL HIGH (ref 70–99)

## 2024-08-31 LAB — PARATHYROID HORMONE, INTACT (NO CA): PTH: 362 pg/mL — ABNORMAL HIGH (ref 15–65)

## 2024-08-31 MED ORDER — CLOPIDOGREL BISULFATE 75 MG PO TABS: 75.0000 mg | ORAL_TABLET | Freq: Every day | ORAL | Status: DC

## 2024-08-31 MED ORDER — OXIDIZED CELLULOSE EX PADS
1.0000 | MEDICATED_PAD | Freq: Once | CUTANEOUS | Status: AC
  Administered 2024-08-31: 1 via TOPICAL
  Filled 2024-08-31: qty 1

## 2024-08-31 MED ORDER — SODIUM CHLORIDE 0.9 % IV SOLN
20.0000 ug | Freq: Once | INTRAVENOUS | Status: AC
  Administered 2024-08-31: 20 ug via INTRAVENOUS
  Filled 2024-08-31: qty 5

## 2024-08-31 MED ORDER — HEPARIN SODIUM (PORCINE) 1000 UNIT/ML IJ SOLN
1000.0000 [IU] | Freq: Once | INTRAMUSCULAR | Status: AC
  Administered 2024-08-31: 3200 [IU] via INTRAVENOUS

## 2024-08-31 MED ORDER — HEPARIN SODIUM (PORCINE) 1000 UNIT/ML IJ SOLN
INTRAMUSCULAR | Status: AC
  Filled 2024-08-31: qty 5

## 2024-08-31 MED ORDER — CALCITRIOL 0.25 MCG PO CAPS
0.2500 ug | ORAL_CAPSULE | ORAL | Status: DC
  Administered 2024-08-31: 0.25 ug via ORAL
  Filled 2024-08-31: qty 1

## 2024-08-31 NOTE — Progress Notes (Signed)
 [facility] NEPHROLOGY PROGRESS NOTE  Assessment/ Plan: Pt is a 78 y.o. yo female with past medical history significant for HTN, HLD, type II DM, COPD, advanced CKD was presented with chest pain, hyperkalemia and worsening renal failure.  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   - [facility] 08/1124 by [facility]- AVF when she has cardiac workup complete - HD #2 today - CLIP complete - can c/d from renal perspective after HD  # Hyperkalemia: Status post medical management now starting dialysis.  Discontinue Lokelma .  Follow lab.  # Hypertension/volume: UF with HD, currently on carvedilol .  # Anemia of CKD: Iron saturation 32% and hemoglobin improved to 10.5.  Monitor hemoglobin and assess for erythropoietin need.  # CKD-MBD: phos acceptable.  Start calcitriol   Subjective: For HD today. Feeling better she says.  No CP   Objective Vital signs in last 24 hours: Vitals:   08/31/24 0500 08/31/24 0814 08/31/24 0903 08/31/24 1011  BP:   (!) 149/58 (!) 159/55  Pulse:  66 63 60  Resp:  20 (!) 21 (!) 26  Temp:   98.9 F (37.2 C) 98.4 F (36.9 C)  TempSrc:   Oral Oral  SpO2:  97% 93% 100%  Weight: 70.7 kg   72.9 kg  Height:       Weight change: -4.4 kg  Intake/Output Summary (Last 24 hours) at 08/31/2024 1022 Last data filed at 08/31/2024 0239 Gross per 24 hour  Intake 363 ml  Output 800 ml  Net -437 ml       Labs: RENAL PANEL Recent Labs  Lab 08/27/24 1748 08/28/24 0241 08/28/24 0831 08/29/24 0252 08/30/24 0223 08/31/24 0637  NA 139 139  --  140 137 140  K 5.4* 6.1* 4.6 4.6 3.7 3.5  CL 113* 114*  --  109 103 106  CO2 18* 19*  --  21* 24 27  GLUCOSE 95 90  --  89 101* 96  BUN 75* 76*  --  70* 44* 46*  CREATININE 6.52* 6.74*  --  6.03* 4.56* 4.99*  CALCIUM  7.9* 7.8*  --  8.1* 7.7* 7.8*  PHOS  --   --   --   --  4.7*  --   ALBUMIN   --   --   --   --  2.8*  --     Liver Function  Tests: Recent Labs  Lab 08/30/24 0223  ALBUMIN  2.8*   No results for input(s): LIPASE, AMYLASE in the last 168 hours. No results for input(s): AMMONIA in the last 168 hours. CBC: Recent Labs    08/27/24 0204 08/27/24 0355 08/27/24 1748 08/28/24 0241 08/28/24 1122 08/29/24 0252  HGB 6.9* 5.9* 9.9* 9.8*  --  10.5*  MCV 90.7 91.8 88.8 89.3  --  88.1  VITAMINB12  --   --   --   --  363  --   FOLATE  --   --   --   --  10.8  --   FERRITIN  --   --   --   --  250  --   TIBC  --   --   --   --  225*  --   IRON  --   --   --   --  71  --   RETICCTPCT  --   --   --   --  1.4  --     Cardiac Enzymes:  No results for input(s): CKTOTAL, CKMB, CKMBINDEX, TROPONINI in the last 168 hours. CBG: Recent Labs  Lab 08/30/24 0625 08/30/24 1151 08/30/24 1619 08/30/24 2103 08/31/24 0624  GLUCAP 85 111* 148* 138* 93    Iron Studies:  Recent Labs    08/28/24 1122  IRON 71  TIBC 225*  FERRITIN 250   Studies/Results: VAS US  UPPER EXT VEIN MAPPING (PRE-OP AVF) Result Date: 08/30/2024 UPPER EXTREMITY VEIN MAPPING Patient Name:  Sabrina Hanson  Date of Exam:   08/30/2024 Medical Rec #: [id number]      Accession #:    [id number] Date of Birth: [birth year]     Patient Gender: F Patient Age:   37 years Exam Location:  [facility] Procedure:      VAS US  UPPER EXT VEIN MAPPING (PRE-OP AVF) Referring Phys: [facility] --------------------------------------------------------------------------------  Indications: Pre-access. History: ESRD.  Comparison Study: Previous study on 5.24.2024. Performing Technologist: Edilia Elden Appl  Examination Guidelines: A complete evaluation includes B-mode imaging, spectral Doppler, color Doppler, and power Doppler as needed of all accessible portions of each vessel. Bilateral testing is considered an integral part of a complete examination. Limited examinations for reoccurring indications may be performed as noted.  +-----------------+-------------+----------+--------------+ Right Cephalic   Diameter (cm)Depth (cm)   Findings    +-----------------+-------------+----------+--------------+ Shoulder             0.29        0.81                  +-----------------+-------------+----------+--------------+ Prox upper arm       0.25        0.39                  +-----------------+-------------+----------+--------------+ Mid upper arm        0.27        0.20                  +-----------------+-------------+----------+--------------+ Dist upper arm       0.29        0.28                  +-----------------+-------------+----------+--------------+ Antecubital fossa    0.48        0.18      Thrombus    +-----------------+-------------+----------+--------------+ Prox forearm         0.31        0.32                  +-----------------+-------------+----------+--------------+ Mid forearm          0.23        0.18     branching    +-----------------+-------------+----------+--------------+ Dist forearm         0.18        0.17     branching    +-----------------+-------------+----------+--------------+ Wrist                                   not visualized +-----------------+-------------+----------+--------------+ +-----------------+-------------+----------+--------------+ Right Basilic    Diameter (cm)Depth (cm)   Findings    +-----------------+-------------+----------+--------------+ Prox upper arm       0.40                              +-----------------+-------------+----------+--------------+ Mid upper arm        0.31                              +-----------------+-------------+----------+--------------+  Dist upper arm       0.37                              +-----------------+-------------+----------+--------------+ Antecubital fossa    0.32                              +-----------------+-------------+----------+--------------+ Prox forearm         0.26                               +-----------------+-------------+----------+--------------+ Mid forearm          0.15                              +-----------------+-------------+----------+--------------+ Distal forearm       0.16                              +-----------------+-------------+----------+--------------+ Elbow                0.23                 branching    +-----------------+-------------+----------+--------------+ Wrist                                   not visualized +-----------------+-------------+----------+--------------+ Chronic appearing thrombus noted in the right cephalic at the antecubital level. +-----------------+-------------+----------+--------------+ Left Cephalic    Diameter (cm)Depth (cm)   Findings    +-----------------+-------------+----------+--------------+ Shoulder             0.32        0.71                  +-----------------+-------------+----------+--------------+ Prox upper arm       0.22        0.53                  +-----------------+-------------+----------+--------------+ Mid upper arm        0.28        0.32                  +-----------------+-------------+----------+--------------+ Dist upper arm       0.29        0.26                  +-----------------+-------------+----------+--------------+ Antecubital fossa    0.37        0.19     branching    +-----------------+-------------+----------+--------------+ Prox forearm         0.28        0.32     branching    +-----------------+-------------+----------+--------------+ Mid forearm                             not visualized +-----------------+-------------+----------+--------------+ Dist forearm         0.20        0.14                  +-----------------+-------------+----------+--------------+ Wrist                0.17        0.14                  +-----------------+-------------+----------+--------------+  +-----------------+-------------+----------+---------+  Left Basilic     Diameter (cm)Depth (cm)Findings  +-----------------+-------------+----------+---------+ Mid upper arm        0.33                         +-----------------+-------------+----------+---------+ Dist upper arm       0.33                         +-----------------+-------------+----------+---------+ Antecubital fossa    0.20               branching +-----------------+-------------+----------+---------+ Prox forearm         0.16                         +-----------------+-------------+----------+---------+ Mid forearm          0.17                         +-----------------+-------------+----------+---------+ Distal forearm       0.12                         +-----------------+-------------+----------+---------+ Elbow                0.29                         +-----------------+-------------+----------+---------+ Wrist                0.15                         +-----------------+-------------+----------+---------+ *See table(s) above for measurements and observations.  Diagnosing physician: Gaile New MD Electronically signed by Gaile New MD on 08/30/2024 at 9:01:08 PM.    Final     Medications: Infusions:   Scheduled Medications:  sodium chloride    Intravenous Once   aspirin  EC  81 mg Oral Daily   atorvastatin   80 mg Oral Daily   budesonide -glycopyrrolate -formoterol   2 puff Inhalation BID   carvedilol   25 mg Oral BID WC   Chlorhexidine  Gluconate Cloth  6 each Topical Q0600   clopidogrel   75 mg Oral Daily   heparin   5,000 Units Subcutaneous Q8H   insulin  aspart  0-5 Units Subcutaneous QHS   insulin  aspart  0-6 Units Subcutaneous TID WC   pantoprazole   40 mg Oral BID AC   sodium chloride  flush  3 mL Intravenous Q12H    have reviewed scheduled and prn medications.  Physical Exam: General:NAD, comfortable Heart:RRR, s1s2 nl Lungs:clear b/l, no crackle Abdomen:soft,  Non-tender, non-distended Extremities:No edema Dialysis Access: Right IJ TDC in place  Zale Marcotte 08/31/2024,10:22 AM  LOS: 4 days

## 2024-08-31 NOTE — Progress Notes (Addendum)
 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  Patient reports back to her facility with ongoing chest pain with profound anemia, AKI on CKD 5 with hyperkalemia.  Hospitalist called for admission, vascular surgery and nephrology called in consult.  Assessment & Plan:   Principal Problem:   Symptomatic anemia Active Problems:   COPD (chronic obstructive pulmonary disease) (HCC)   Hypertension   Chronic heart failure with preserved ejection fraction (HFpEF) (HCC)   Metabolic acidosis   Hyperkalemia   Unstable angina (HCC)   Acute renal failure superimposed on stage 5 chronic kidney disease, not on chronic dialysis (HCC)   Thyroid  mass  AKI on CKD 5, hyperkalemia - Patient persistently hyperkalemic with profoundly elevated creatinine - Nephrology and vascular surgery following, status post temporary dialysis catheter placement 12/11 - Tentative plan for HD per nephrology schedule - Patient reports she still makes urine, follow I's and O's - AV fistula placement versus graft pending further evaluation in the outpatient setting - Patient has secured dialysis slot, plan to continue HD here in house over the weekend with discharge to outpatient dialysis on Tuesday, 09/03/2024  Acute on chronic anemia - Secondary to CKD, no history of overt bleeding noted - 2 unit PRBC administered at intake, hemoglobin otherwise stabilizing - Noted blood around HD catheter, hold Plavix  x 48 hours, if continues to be an issue or hemoglobin drops will discuss with vascular surgery in the next 24 hours although occlusive bandages or hemostatic  bandages at this time may be appropriate, wound care has been consulted  Chest pain, subacute to chronic, multivessel CAD - Per prior notes has been offered CABG versus high risk PCI at [facility] earlier this year which she declined due to fear of progressive CKD - Echo 12/10 -EF 60 to 65% normal LV function with grade 1 diastolic dysfunction  - Defer to cardiology for ongoing ischemic evaluation once established on HD - Continue aspirin , Plavix , Coreg  and statin per cardiology   History of PUD - Over a year ago, continue PPI   Chronic diastolic CHF, grade 1 - Repeat echo confirms grade 1 diastolic dysfunction   COPD, chronic hypoxia - Stable, continue home nebs/oxygen at baseline   Type 2 diabetes mellitus - CBG 70-140, continue SSI for now   Thyroid  mass - Noted incidentally, ultrasound notable for goiter - TSH and free T4 unremarkable, recommend biopsy and follow-up as outpatient  DVT prophylaxis: heparin  injection 5,000 Units Start: 08/27/24 0600 Code Status:   Code Status: Full Code Family Communication: At bedside  Status is: Inpatient  Dispo: The patient is from: Home              Anticipated d/c is to: To be determined              Anticipated d/c date is: To be determined              Patient currently not medically stable for discharge  Consultants:  Nephrology, cardiology, vascular surgery  Procedures:  Temporary dialysis catheter and AV fistula placement 12/11  Antimicrobials:  None  Subjective: No acute issues or events overnight, tolerated dialysis yesterday afternoon quite well, otherwise feels markedly improved from  previous denies nausea vomiting diarrhea constipation headache fever chills or chest pain  Objective: Vitals:   08/30/24 1935 08/30/24 2313 08/31/24 0239 08/31/24 0500  BP: (!) 157/57 (!) 183/63 (!) 134/48   Pulse: (!) 59 64 (!) 57   Resp: (!) 22 (!) 21 19   Temp: 98.3 F (36.8 C) 98.3 F (36.8 C) 98.8 F (37.1 C)   TempSrc: Oral Oral  Oral   SpO2: 95% 97% 100%   Weight:    70.7 kg  Height:        Intake/Output Summary (Last 24 hours) at 08/31/2024 0754 Last data filed at 08/31/2024 0239 Gross per 24 hour  Intake 366 ml  Output 800 ml  Net -434 ml   Filed Weights   08/29/24 1748 08/30/24 0306 08/31/24 0500  Weight: 75 kg 71.7 kg 70.7 kg    Examination:  General:  Pleasantly resting in bed, No acute distress. HEENT:  Normocephalic atraumatic.  Sclerae nonicteric, noninjected.  Extraocular movements intact bilaterally. Neck: Right sided dialysis catheter placement clean dry intact. Lungs:  Clear to auscultate bilaterally without rhonchi, wheeze, or rales. Heart:  Regular rate and rhythm.  Without murmurs, rubs, or gallops. Abdomen:  Soft, nontender, nondistended.  Without guarding or rebound. Extremities: Left upper extremity bandage clean dry intact  Data Reviewed: I have personally reviewed following labs and imaging studies  CBC: Recent Labs  Lab 08/27/24 0204 08/27/24 0355 08/27/24 1748 08/28/24 0241 08/29/24 0252  WBC 5.3 4.7 5.5 5.9 6.0  HGB 6.9* 5.9* 9.9* 9.8* 10.5*  HCT 23.5* 20.1* 32.6* 31.7* 33.3*  MCV 90.7 91.8 88.8 89.3 88.1  PLT 184 165 160 166 166   Basic Metabolic Panel: Recent Labs  Lab 08/27/24 1748 08/28/24 0241 08/28/24 0831 08/29/24 0252 08/30/24 0223 08/31/24 0637  NA 139 139  --  140 137 140  K 5.4* 6.1* 4.6 4.6 3.7 3.5  CL 113* 114*  --  109 103 106  CO2 18* 19*  --  21* 24 27  GLUCOSE 95 90  --  89 101* 96  BUN 75* 76*  --  70* 44* 46*  CREATININE 6.52* 6.74*  --  6.03* 4.56* 4.99*  CALCIUM  7.9* 7.8*  --  8.1* 7.7* 7.8*  PHOS  --   --   --   --  4.7*  --    GFR: Estimated Creatinine Clearance: 8.8 mL/min (A) (by C-G formula based on SCr of 4.99 mg/dL (H)). Liver Function Tests: Recent Labs  Lab 08/30/24 0223  ALBUMIN  2.8*   No results for input(s): LIPASE, AMYLASE in the last 168 hours. No results for input(s): AMMONIA in the last 168  hours. Coagulation Profile: No results for input(s): INR, PROTIME in the last 168 hours. Cardiac Enzymes: No results for input(s): CKTOTAL, CKMB, CKMBINDEX, TROPONINI in the last 168 hours. BNP (last 3 results) No results for input(s): PROBNP in the last 8760 hours. HbA1C: No results for input(s): HGBA1C in the last 72 hours. CBG: Recent Labs  Lab 08/30/24 0625 08/30/24 1151 08/30/24 1619 08/30/24 2103 08/31/24 0624  GLUCAP 85 111* 148* 138* 93   Lipid Profile: No results for input(s): CHOL, HDL, LDLCALC, TRIG, CHOLHDL, LDLDIRECT in the last 72 hours. Thyroid  Function Tests: No results for input(s): TSH, T4TOTAL, FREET4, T3FREE, THYROIDAB in the last 72 hours.  Anemia Panel: Recent Labs    08/28/24 1122  VITAMINB12 363  FOLATE 10.8  FERRITIN 250  TIBC 225*  IRON 71  RETICCTPCT 1.4   Sepsis Labs: No  results for input(s): PROCALCITON, LATICACIDVEN in the last 168 hours.  Recent Results (from the past 240 hours)  Surgical PCR screen     Status: None   Collection Time: 08/29/24  5:32 AM   Specimen: Nasal Mucosa; Nasal Swab  Result Value Ref Range Status   MRSA, PCR NEGATIVE NEGATIVE Final   Staphylococcus aureus NEGATIVE NEGATIVE Final    Comment: (NOTE) The Xpert SA Assay (FDA approved for NASAL specimens in patients 8 years of age and older), is one component of a comprehensive surveillance program. It is not intended to diagnose infection nor to guide or monitor treatment. Performed at [facility], [address], [place], [place] [address]          Radiology Studies: VAS US  UPPER EXT VEIN MAPPING (PRE-OP AVF) Result Date: 08/30/2024 UPPER EXTREMITY VEIN MAPPING Patient Name:  Sabrina Hanson  Date of Exam:   08/30/2024 Medical Rec #: [id number]      Accession #:    [id number] Date of Birth: 21-Mar-1946     Patient Gender: F Patient Age:   80 years Exam Location:  [facility] Procedure:      VAS US   UPPER EXT VEIN MAPPING (PRE-OP AVF) Referring Phys: [facility] --------------------------------------------------------------------------------  Indications: Pre-access. History: ESRD.  Comparison Study: Previous study on 5.24.2024. Performing Technologist: Edilia Elden Appl  Examination Guidelines: A complete evaluation includes B-mode imaging, spectral Doppler, color Doppler, and power Doppler as needed of all accessible portions of each vessel. Bilateral testing is considered an integral part of a complete examination. Limited examinations for reoccurring indications may be performed as noted. +-----------------+-------------+----------+--------------+ Right Cephalic   Diameter (cm)Depth (cm)   Findings    +-----------------+-------------+----------+--------------+ Shoulder             0.29        0.81                  +-----------------+-------------+----------+--------------+ Prox upper arm       0.25        0.39                  +-----------------+-------------+----------+--------------+ Mid upper arm        0.27        0.20                  +-----------------+-------------+----------+--------------+ Dist upper arm       0.29        0.28                  +-----------------+-------------+----------+--------------+ Antecubital fossa    0.48        0.18      Thrombus    +-----------------+-------------+----------+--------------+ Prox forearm         0.31        0.32                  +-----------------+-------------+----------+--------------+ Mid forearm          0.23        0.18     branching    +-----------------+-------------+----------+--------------+ Dist forearm         0.18        0.17     branching    +-----------------+-------------+----------+--------------+ Wrist                                   not visualized +-----------------+-------------+----------+--------------+ +-----------------+-------------+----------+--------------+ Right  Basilic     Diameter (cm)Depth (cm)   Findings    +-----------------+-------------+----------+--------------+ Prox upper arm       0.40                              +-----------------+-------------+----------+--------------+ Mid upper arm        0.31                              +-----------------+-------------+----------+--------------+ Dist upper arm       0.37                              +-----------------+-------------+----------+--------------+ Antecubital fossa    0.32                              +-----------------+-------------+----------+--------------+ Prox forearm         0.26                              +-----------------+-------------+----------+--------------+ Mid forearm          0.15                              +-----------------+-------------+----------+--------------+ Distal forearm       0.16                              +-----------------+-------------+----------+--------------+ Elbow                0.23                 branching    +-----------------+-------------+----------+--------------+ Wrist                                   not visualized +-----------------+-------------+----------+--------------+ Chronic appearing thrombus noted in the right cephalic at the antecubital level. +-----------------+-------------+----------+--------------+ Left Cephalic    Diameter (cm)Depth (cm)   Findings    +-----------------+-------------+----------+--------------+ Shoulder             0.32        0.71                  +-----------------+-------------+----------+--------------+ Prox upper arm       0.22        0.53                  +-----------------+-------------+----------+--------------+ Mid upper arm        0.28        0.32                  +-----------------+-------------+----------+--------------+ Dist upper arm       0.29        0.26                  +-----------------+-------------+----------+--------------+ Antecubital fossa     0.37        0.19     branching    +-----------------+-------------+----------+--------------+ Prox forearm         0.28        0.32     branching    +-----------------+-------------+----------+--------------+ Mid  forearm                             not visualized +-----------------+-------------+----------+--------------+ Dist forearm         0.20        0.14                  +-----------------+-------------+----------+--------------+ Wrist                0.17        0.14                  +-----------------+-------------+----------+--------------+ +-----------------+-------------+----------+---------+ Left Basilic     Diameter (cm)Depth (cm)Findings  +-----------------+-------------+----------+---------+ Mid upper arm        0.33                         +-----------------+-------------+----------+---------+ Dist upper arm       0.33                         +-----------------+-------------+----------+---------+ Antecubital fossa    0.20               branching +-----------------+-------------+----------+---------+ Prox forearm         0.16                         +-----------------+-------------+----------+---------+ Mid forearm          0.17                         +-----------------+-------------+----------+---------+ Distal forearm       0.12                         +-----------------+-------------+----------+---------+ Elbow                0.29                         +-----------------+-------------+----------+---------+ Wrist                0.15                         +-----------------+-------------+----------+---------+ *See table(s) above for measurements and observations.  Diagnosing physician: Gaile New MD Electronically signed by Gaile New MD on 08/30/2024 at 9:01:08 PM.    Final    DG Chest Port 1 View Result Date: 08/29/2024 EXAM: XR Chest, 1 View(s). CLINICAL HISTORY: 78 year old female. Status post dialysis catheter  insertion. COMPARISON: 08/27/2024. Thyroid  ultrasound 08/27/2024. FINDINGS: LUNGS: Mild linear subsegmental atelectasis or scarring in left mid lung. PLEURAL SPACES: No pleural effusion or pneumothorax. HEART: The heart size is normal. Dense aortic arch calcifications. 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 No acute osseous abnormality. LINES AND TUBES: Right IJ dual lumen central venous catheter in place with tip at cavoatrial junction. IMPRESSION: 1. Right internal jugular dual-lumen central venous catheter with tip at the cavoatrial junction. 2. Mild linear atelectasis or scarring in the left mid lung. 3. No other acute cardiopulmonary abnormality. 4. 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 Electronically signed by: Helayne Hurst MD 08/29/2024 09:43 AM EST RP Workstation: [id number]   DG C-Arm 1-60 Min-No Report Result  Date: 08/29/2024 Fluoroscopy was utilized by the requesting physician.  No radiographic interpretation.        Scheduled Meds:  sodium chloride    Intravenous Once   aspirin  EC  81 mg Oral Daily   atorvastatin   80 mg Oral Daily   budesonide -glycopyrrolate -formoterol   2 puff Inhalation BID   carvedilol   25 mg Oral BID WC   Chlorhexidine  Gluconate Cloth  6 each Topical Q0600   clopidogrel   75 mg Oral Daily   heparin   5,000 Units Subcutaneous Q8H   insulin  aspart  0-5 Units Subcutaneous QHS   insulin  aspart  0-6 Units Subcutaneous TID WC   pantoprazole   40 mg Oral BID AC   sodium chloride  flush  3 mL Intravenous Q12H   Continuous Infusions:     LOS: 4 days   Time spent:  Elsie JAYSON Montclair, DO [facility]  If 7PM-7AM, please contact night-coverage [email or web address]  08/31/2024, 7:54 AM

## 2024-09-01 DIAGNOSIS — D649 Anemia, unspecified: Secondary | ICD-10-CM | POA: Diagnosis not present

## 2024-09-01 LAB — GLUCOSE, CAPILLARY
Glucose-Capillary: 90 mg/dL (ref 70–99)
Glucose-Capillary: 103 mg/dL — ABNORMAL HIGH (ref 70–99)

## 2024-09-01 MED ORDER — PANTOPRAZOLE SODIUM 40 MG PO TBEC: 40.0000 mg | DELAYED_RELEASE_TABLET | Freq: Every day | ORAL | 0 refills | Status: AC

## 2024-09-01 MED ORDER — CALCITRIOL 0.25 MCG PO CAPS: 0.2500 ug | ORAL_CAPSULE | ORAL | 0 refills | Status: AC

## 2024-09-01 NOTE — Progress Notes (Signed)
 Mobility Specialist Progress Note;   09/01/24 0946  Mobility  Activity Ambulated with assistance  Level of Assistance Standby assist, set-up cues, supervision of patient - no hands on  Assistive Device Front wheel walker  Distance Ambulated (ft) 200 ft  Activity Response Tolerated well  Mobility Referral Yes  Mobility visit 1 Mobility  Mobility Specialist Start Time (ACUTE ONLY) 0946  Mobility Specialist Stop Time (ACUTE ONLY) 0956  Mobility Specialist Time Calculation (min) (ACUTE ONLY) 10 min   Pt eager for mobility. On 2LO2 upon arrival. Required no physical assistance during ambulation, SV for safety. Ambulated on 2LO2, VSS throughout. Pt returned back to bed and left with all needs met, call bell in reach.   Lauraine Erm Mobility Specialist Please contact via SecureChat or [employer] [phone]

## 2024-09-01 NOTE — Discharge Summary (Signed)
 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  Patient reports back to her facility with ongoing chest pain with profound anemia, AKI on CKD 5 with hyperkalemia.  Hospitalist called for admission, vascular surgery and nephrology called in consult.  Patient admitted as above with worsening renal function on CKD 5 with profound hyperkalemia, ultimately required temporary dialysis catheter placement and dialysis.  Given patient's stability and discussion with nephrology she is otherwise stable and agreeable for discharge home.  She has dialysis planned 09/03/2024 and is otherwise at this time doing quite well.  Patient's transient episode of chest pain appears to be resolved, she was previously unable to undergo CABG as proffered by [facility] earlier this year due to concern over contrast.  At this time given patient's now hemodialysis dependent state this can be follow-up in the outpatient setting for further discussion.  Patient's other chronic comorbid conditions appear to be well-controlled, in regards to diuretics in the setting of heart failure will discontinue these as patient's volume status will be controlled with dialysis.   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, hyperkalemia - Temporary dialysis catheter placement 12/11 -Continue HD per nephrology - Outpatient dialysis on Tuesday, 09/03/2024   Acute on chronic anemia - Secondary to CKD, no history of overt bleeding noted - 2 unit PRBC administered at intake, hemoglobin otherwise stabilizing - Noted blood around HD catheter has now resolved, resume Plavix    Chest pain, subacute to chronic, multivessel CAD, resolving - Per prior notes has been offered CABG versus high risk PCI at [facility] earlier this year which she declined due to fear of progressive CKD - Echo 12/10 -EF 60 to 65% normal LV function with grade 1 diastolic dysfunction  - Defer to outpatient cardiology for ongoing ischemic evaluation once established on HD - Continue aspirin , Plavix , Coreg  and statin per cardiology   History of PUD - Continue PPI Chronic diastolic CHF, grade 1 - Repeat echo confirms grade 1 diastolic dysfunction COPD, chronic hypoxia - Stable, continue home nebs/oxygen at baseline Type 2 diabetes mellitus -resume home regimen Thyroid  mass - Noted incidentally, ultrasound notable for goiter - TSH and free T4 unremarkable, recommend biopsy and follow-up as outpatient  Discharge Instructions  Discharge Instructions     Call MD for:   Complete by: As directed    If you see any bleeding around new dialysis catheter   Call MD for:  difficulty breathing, headache or visual disturbances   Complete by: As directed    Call MD  for:  extreme fatigue   Complete by: As directed    Call MD for:  hives   Complete by: As  directed    Call MD for:  persistant dizziness or light-headedness   Complete by: As directed    Call MD for:  persistant nausea and vomiting   Complete by: As directed    Call MD for:  severe uncontrolled pain   Complete by: As directed    Call MD for:  temperature >100.4   Complete by: As directed    Increase activity slowly   Complete by: As directed    No wound care   Complete by: As directed       Allergies as of 09/01/2024       Reactions   Other Other (See Comments)   PATIENT IS EXTREMELY SENSITIVE TO ALL PAIN MEDICATIONS PER FAMILY MEMBERS WHO MANAGE MEDS AT HOME.    Oxycodone Other (See Comments)   Drowsiness, shaking    Pregabalin Anxiety, Other (See Comments)   Altered mental status tremors        Medication List     STOP taking these medications    furosemide  20 MG tablet Commonly known as: LASIX    isosorbide  mononitrate 30 MG 24 hr tablet Commonly known as: IMDUR    nitroGLYCERIN  0.4 MG SL tablet Commonly known as: NITROSTAT        TAKE these medications    aspirin  EC 81 MG tablet Take 1 tablet (81 mg total) by mouth daily.   atorvastatin  80 MG tablet Commonly known as: LIPITOR  Take 1 tablet (80 mg total) by mouth daily.   calcitRIOL  0.25 MCG capsule Commonly known as: ROCALTROL  Take 1 capsule (0.25 mcg total) by mouth Every Tuesday,Thursday,and Saturday with dialysis. Start taking on: September 03, 2024   carvedilol  25 MG tablet Commonly known as: COREG  Take 1 tablet (25 mg total) by mouth 2 (two) times daily with a meal.   clopidogrel  75 MG tablet Commonly known as: PLAVIX  Take 1 tablet (75 mg total) by mouth daily.   docusate sodium 100 MG capsule Commonly known as: COLACE Take 100 mg by mouth at bedtime.   fluticasone 50 MCG/ACT nasal spray Commonly known as: FLONASE Place 1 spray into both nostrils daily as needed for allergies or rhinitis.   pantoprazole  40 MG tablet Commonly known as: PROTONIX  Take 1 tablet (40 mg total)  by mouth daily.   sodium bicarbonate  650 MG tablet Take 650 mg by mouth 2 (two) times daily.   Trelegy Ellipta 100-62.5-25 MCG/ACT Aepb Generic drug: Fluticasone-Umeclidin-Vilant Inhale 1 puff into the lungs daily at 6 (six) AM.        Follow-up Information     [name], [name], MD Follow up on 09/02/2024.   Specialty: Family Medicine Why: 3:00 Follow up with Marquita Collet PA Contact information: [address] [facility] [place] [address] [phone]         [facility] Go on 09/03/2024.   Why: Schedule is Tuesday, Thursday, Saturday with 11:45 am start time.  On Tuesday, please arrive at 11:00 am to complete paperwork prior to treatment. Contact information: [address] [place] [address] [phone]                Allergies[1]  Consultations: Vascular surgery, nephrology  Procedures/Studies: VAS US  UPPER EXT VEIN MAPPING (PRE-OP AVF) Result Date: 08/30/2024 UPPER EXTREMITY VEIN MAPPING Patient Name:  JAINA MORIN  Date of Exam:   08/30/2024 Medical Rec #:  [id number]      Accession #:    [id number] Date of Birth: [birth year]     Patient Gender: F Patient Age:   30 years Exam Location:  [facility] Procedure:      VAS US  UPPER EXT VEIN MAPPING (PRE-OP AVF) Referring Phys: [facility] --------------------------------------------------------------------------------  Indications: Pre-access. History: ESRD.  Comparison Study: Previous study on 5.24.2024. Performing Technologist: Edilia Elden Appl  Examination Guidelines: A complete evaluation includes B-mode imaging, spectral Doppler, color Doppler, and power Doppler as needed of all accessible portions of each vessel. Bilateral testing is considered an integral part of a complete examination. Limited examinations for reoccurring indications may be performed as noted. +-----------------+-------------+----------+--------------+ Right Cephalic   Diameter  (cm)Depth (cm)   Findings    +-----------------+-------------+----------+--------------+ Shoulder             0.29        0.81                  +-----------------+-------------+----------+--------------+ Prox upper arm       0.25        0.39                  +-----------------+-------------+----------+--------------+ Mid upper arm        0.27        0.20                  +-----------------+-------------+----------+--------------+ Dist upper arm       0.29        0.28                  +-----------------+-------------+----------+--------------+ Antecubital fossa    0.48        0.18      Thrombus    +-----------------+-------------+----------+--------------+ Prox forearm         0.31        0.32                  +-----------------+-------------+----------+--------------+ Mid forearm          0.23        0.18     branching    +-----------------+-------------+----------+--------------+ Dist forearm         0.18        0.17     branching    +-----------------+-------------+----------+--------------+ Wrist                                   not visualized +-----------------+-------------+----------+--------------+ +-----------------+-------------+----------+--------------+ Right Basilic    Diameter (cm)Depth (cm)   Findings    +-----------------+-------------+----------+--------------+ Prox upper arm       0.40                              +-----------------+-------------+----------+--------------+ Mid upper arm        0.31                              +-----------------+-------------+----------+--------------+ Dist upper arm       0.37                              +-----------------+-------------+----------+--------------+ Antecubital fossa    0.32                              +-----------------+-------------+----------+--------------+  Prox forearm         0.26                               +-----------------+-------------+----------+--------------+ Mid forearm          0.15                              +-----------------+-------------+----------+--------------+ Distal forearm       0.16                              +-----------------+-------------+----------+--------------+ Elbow                0.23                 branching    +-----------------+-------------+----------+--------------+ Wrist                                   not visualized +-----------------+-------------+----------+--------------+ Chronic appearing thrombus noted in the right cephalic at the antecubital level. +-----------------+-------------+----------+--------------+ Left Cephalic    Diameter (cm)Depth (cm)   Findings    +-----------------+-------------+----------+--------------+ Shoulder             0.32        0.71                  +-----------------+-------------+----------+--------------+ Prox upper arm       0.22        0.53                  +-----------------+-------------+----------+--------------+ Mid upper arm        0.28        0.32                  +-----------------+-------------+----------+--------------+ Dist upper arm       0.29        0.26                  +-----------------+-------------+----------+--------------+ Antecubital fossa    0.37        0.19     branching    +-----------------+-------------+----------+--------------+ Prox forearm         0.28        0.32     branching    +-----------------+-------------+----------+--------------+ Mid forearm                             not visualized +-----------------+-------------+----------+--------------+ Dist forearm         0.20        0.14                  +-----------------+-------------+----------+--------------+ Wrist                0.17        0.14                  +-----------------+-------------+----------+--------------+ +-----------------+-------------+----------+---------+ Left Basilic      Diameter (cm)Depth (cm)Findings  +-----------------+-------------+----------+---------+ Mid upper arm        0.33                         +-----------------+-------------+----------+---------+ Dist upper arm       0.33                         +-----------------+-------------+----------+---------+  Antecubital fossa    0.20               branching +-----------------+-------------+----------+---------+ Prox forearm         0.16                         +-----------------+-------------+----------+---------+ Mid forearm          0.17                         +-----------------+-------------+----------+---------+ Distal forearm       0.12                         +-----------------+-------------+----------+---------+ Elbow                0.29                         +-----------------+-------------+----------+---------+ Wrist                0.15                         +-----------------+-------------+----------+---------+ *See table(s) above for measurements and observations.  Diagnosing physician: Gaile New MD Electronically signed by Gaile New MD on 08/30/2024 at 9:01:08 PM.    Final    DG Chest Port 1 View Result Date: 08/29/2024 EXAM: XR Chest, 1 View(s). CLINICAL HISTORY: 78 year old female. Status post dialysis catheter insertion. COMPARISON: 08/27/2024. Thyroid  ultrasound 08/27/2024. FINDINGS: LUNGS: Mild linear subsegmental atelectasis or scarring in left mid lung. PLEURAL SPACES: No pleural effusion or pneumothorax. HEART: The heart size is normal. Dense aortic arch calcifications. 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 No acute osseous abnormality. LINES AND TUBES: Right IJ dual lumen central venous catheter in place with tip at cavoatrial junction. IMPRESSION: 1. Right internal jugular dual-lumen central venous catheter with tip at the cavoatrial junction. 2. Mild linear atelectasis or  scarring in the left mid lung. 3. No other acute cardiopulmonary abnormality. 4. 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 Electronically signed by: Helayne Hurst MD 08/29/2024 09:43 AM EST RP Workstation: [id number]   DG C-Arm 1-60 Min-No Report Result Date: 08/29/2024 Fluoroscopy was utilized by the requesting physician.  No radiographic interpretation.   ECHOCARDIOGRAM COMPLETE Result Date: 08/28/2024    ECHOCARDIOGRAM REPORT   Patient Name:   CARINA CHAPLIN Date of Exam: 08/28/2024 Medical Rec #:  [id number]     Height:       62.0 in Accession #:    [id number]    Weight:       159.8 lb Date of Birth:  Dec 07, 1945    BSA:          1.738 m Patient Age:    77 years      BP:           150/66 mmHg Patient Gender: F             HR:           61 bpm. Exam Location:  Inpatient Procedure: 2D Echo, Cardiac Doppler and Color Doppler (Both Spectral and Color            Flow Doppler were utilized during procedure). Indications:    CHF - Acute Diastolic I50.31  History:  Patient has no prior history of Echocardiogram examinations.                 CHF, Previous Myocardial Infarction and Angina, COPD and CKD,                 stage 5, Signs/Symptoms:Shortness of Breath; Risk                 Factors:Dyslipidemia and Diabetes.  Sonographer:    Thea Norlander RCS Referring Phys: [phone] PREETHA JOSEPH IMPRESSIONS  1. Left ventricular ejection fraction, by estimation, is 60 to 65%. The left ventricle has normal function. The left ventricle has no regional wall motion abnormalities. Left ventricular diastolic parameters are consistent with Grade I diastolic dysfunction (impaired relaxation). Elevated left atrial pressure.  2. Right ventricular systolic function is normal. The right ventricular size is mildly enlarged. There is mildly elevated pulmonary artery systolic pressure. The estimated right ventricular systolic pressure is 37.3 mmHg.  3. Right atrial size was mildly  dilated.  4. The mitral valve is degenerative. No evidence of mitral valve regurgitation. Mild mitral stenosis. The mean mitral valve gradient is 4.5 mmHg. Severe mitral annular calcification.  5. Tricuspid valve regurgitation is mild to moderate.  6. The aortic valve is tricuspid. There is mild calcification of the aortic valve. Aortic valve regurgitation is not visualized. No aortic stenosis is present. Aortic valve area, by VTI measures 2.31 cm. Aortic valve mean gradient measures 7.0 mmHg. Aortic valve Vmax measures 1.73 m/s.  7. The inferior vena cava is normal in size with greater than 50% respiratory variability, suggesting right atrial pressure of 3 mmHg. FINDINGS  Left Ventricle: Left ventricular ejection fraction, by estimation, is 60 to 65%. The left ventricle has normal function. The left ventricle has no regional wall motion abnormalities. The left ventricular internal cavity size was normal in size. There is  no left ventricular hypertrophy. Left ventricular diastolic parameters are consistent with Grade I diastolic dysfunction (impaired relaxation). Elevated left atrial pressure. Right Ventricle: The right ventricular size is mildly enlarged. No increase in right ventricular wall thickness. Right ventricular systolic function is normal. There is mildly elevated pulmonary artery systolic pressure. The tricuspid regurgitant velocity is 2.93 m/s, and with an assumed right atrial pressure of 3 mmHg, the estimated right ventricular systolic pressure is 37.3 mmHg. Left Atrium: Left atrial size was normal in size. Right Atrium: Right atrial size was mildly dilated. Pericardium: There is no evidence of pericardial effusion. Mitral Valve: The mitral valve is degenerative in appearance. Severe mitral annular calcification. No evidence of mitral valve regurgitation. Mild mitral valve stenosis. MV peak gradient, 11.7 mmHg. The mean mitral valve gradient is 4.5 mmHg. Tricuspid Valve: The tricuspid valve is normal  in structure. Tricuspid valve regurgitation is mild to moderate. No evidence of tricuspid stenosis. Aortic Valve: The aortic valve is tricuspid. There is mild calcification of the aortic valve. Aortic valve regurgitation is not visualized. No aortic stenosis is present. Aortic valve mean gradient measures 7.0 mmHg. Aortic valve peak gradient measures 12.0 mmHg. Aortic valve area, by VTI measures 2.31 cm. Pulmonic Valve: The pulmonic valve was normal in structure. Pulmonic valve regurgitation is not visualized. No evidence of pulmonic stenosis. Aorta: The aortic root is normal in size and structure. Venous: The inferior vena cava is normal in size with greater than 50% respiratory variability, suggesting right atrial pressure of 3 mmHg. IAS/Shunts: No atrial level shunt detected by color flow Doppler.  LEFT VENTRICLE PLAX 2D LVIDd:  4.80 cm     Diastology LVIDs:         3.20 cm     LV e' medial:    5.70 cm/s LV PW:         1.00 cm     LV E/e' medial:  26.7 LV IVS:        1.10 cm     LV e' lateral:   8.85 cm/s LVOT diam:     1.90 cm     LV E/e' lateral: 17.2 LV SV:         97 LV SV Index:   56 LVOT Area:     2.84 cm LV IVRT:       81 msec  LV Volumes (MOD) LV vol d, MOD A2C: 88.7 ml LV vol d, MOD A4C: 73.2 ml LV vol s, MOD A2C: 32.2 ml LV vol s, MOD A4C: 25.0 ml LV SV MOD A2C:     56.5 ml LV SV MOD A4C:     73.2 ml LV SV MOD BP:      53.4 ml RIGHT VENTRICLE             IVC RV S prime:     12.10 cm/s  IVC diam: 2.20 cm TAPSE (M-mode): 2.9 cm LEFT ATRIUM             Index        RIGHT ATRIUM           Index LA diam:        3.20 cm 1.84 cm/m   RA Area:     19.10 cm LA Vol (A2C):   51.1 ml 29.40 ml/m  RA Volume:   55.70 ml  32.05 ml/m LA Vol (A4C):   56.1 ml 32.28 ml/m LA Biplane Vol: 54.3 ml 31.24 ml/m  AORTIC VALVE AV Area (Vmax):    2.43 cm AV Area (Vmean):   2.30 cm AV Area (VTI):     2.31 cm AV Vmax:           173.00 cm/s AV Vmean:          121.000 cm/s AV VTI:            0.418 m AV Peak Grad:       12.0 mmHg AV Mean Grad:      7.0 mmHg LVOT Vmax:         148.00 cm/s LVOT Vmean:        98.300 cm/s LVOT VTI:          0.341 m LVOT/AV VTI ratio: 0.82  AORTA Ao Root diam: 2.70 cm Ao Asc diam:  2.90 cm MITRAL VALVE                TRICUSPID VALVE MV Area (PHT): 3.12 cm     TR Peak grad:   34.3 mmHg MV Area VTI:   1.86 cm     TR Vmax:        293.00 cm/s MV Peak grad:  11.7 mmHg MV Mean grad:  4.5 mmHg     SHUNTS MV Vmax:       1.71 m/s     Systemic VTI:  0.34 m MV Vmean:      101.4 cm/s   Systemic Diam: 1.90 cm MV Decel Time: 243 msec MR Peak grad: 63.8 mmHg MR Vmax:      399.50 cm/s MV E velocity: 152.00 cm/s MV A velocity: 178.00 cm/s MV E/A ratio:  0.85 [employer]  MD Electronically signed by Oneil Parchment MD Signature Date/Time: 08/28/2024/5:07:42 PM    Final    US  THYROID  Result Date: 08/27/2024 CLINICAL DATA:  Incidental on other study. [id number] Thyroid  mass of unclear etiology [id number] EXAM: THYROID  ULTRASOUND TECHNIQUE: Ultrasound examination of the thyroid  gland and adjacent soft tissues was performed. COMPARISON:  Chest XR, 08/27/2024. FINDINGS: Parenchymal Echotexture: Moderately heterogenous Isthmus: 1.1 cm Right lobe: 8.5 x 1.6 x 4.8 cm Left lobe: 7.5 x 3.5 x 4.1 cm _________________________________________________________ Estimated total number of nodules >/= 1 cm: 2 Number of spongiform nodules >/=  2 cm not described below (TR1): 0 Number of mixed cystic and solid nodules >/= 1.5 cm not described below (TR2): 0 _________________________________________________________ Nodule # 1: Location: RIGHT; Mid Maximum size: 4.0 cm; Other 2 dimensions: 4.0 x 3.2 cm Composition: solid/almost completely solid (2) Echogenicity: isoechoic (1) Shape: not taller-than-wide (0) Margins: ill-defined (0) Echogenic foci: none (0) ACR TI-RADS total points: 3. ACR TI-RADS risk category: TR3 (3 points). ACR TI-RADS recommendations: **Given size (>/= 2.5 cm) and appearance, fine needle aspiration of this mildly suspicious  nodule should be considered based on TI-RADS criteria. _________________________________________________________ Nodule # 2: Location: LEFT; Mid Maximum size: 6.2 cm; Other 2 dimensions: 2.8 x 3.2 cm Composition: solid/almost completely solid (2) Echogenicity: isoechoic (1) Shape: not taller-than-wide (0) Margins: ill-defined (0) Echogenic foci: none (0) ACR TI-RADS total points: 3. ACR TI-RADS risk category: TR3 (3 points). ACR TI-RADS recommendations: **Given size (>/= 2.5 cm) and appearance, fine needle aspiration of this mildly suspicious nodule should be considered based on TI-RADS criteria. _________________________________________________________ Sub-1 cm cystic nodule imaged within the LEFT gland does not meet threshold for follow-up nor biopsy per current criteria. No cervical adenopathy or abnormal fluid collection within the imaged neck. IMPRESSION: 1. 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 2. Dominant, 4.0 cm RIGHT mid and 6.2 cm LEFT mid TR-3 thyroid  nodules. Non-emergent, outpatient FNA biopsies of these mildly suspicious nodules should be considered based on TI-RADS criteria. The above is in keeping with the ACR TI-RADS recommendations - J Am Coll Radiol 2017;14:587-595. Electronically Signed   By: Thom Hall M.D.   On: 08/27/2024 07:25   DG Chest 2 View Result Date: 08/27/2024 EXAM: 2 VIEW(S) XRAY OF THE CHEST 08/27/2024 02:42:00 AM COMPARISON: Portable chest 08/18/2024. CLINICAL HISTORY: Chest pain. FINDINGS: LUNGS AND PLEURA: Left mid lung linear scarring or atelectasis. Mild chronic interstitial change. No evidence of focal pneumonia. No pleural effusion. No pneumothorax. HEART AND MEDIASTINUM: The aorta is tortuous and calcified. The mediastinum is stable. The heart is slightly enlarged. No vascular congestion is seen. Smooth mass effect on the right side of the trachea at the level of T1 and T2, most typically this would be due to a thyroid  mass. A nonemergent  thyroid  ultrasound is recommended. BONES AND SOFT TISSUES: Osteopenia and thoracic spondylosis. IMPRESSION: 1. No acute cardiopulmonary findings. 2. Smooth right paratracheal extrinsic mass effect at T1 extrahepatic T2, most consistent with a thyroid  mass. Recommend nonemergent thyroid  ultrasound for further characterization. Electronically signed by: Francis Quam MD 08/27/2024 02:51 AM EST RP Workstation: [id number]   DG Chest Portable 1 View Result Date: 08/18/2024 EXAM: 1 VIEW(S) XRAY OF THE CHEST 08/18/2024 10:38:00 PM COMPARISON: 04/15/2024 CLINICAL HISTORY: Chest Pain FINDINGS: LUNGS AND PLEURA: Glenoid airspace opacity in the left mid lung likely represents atelectasis or scarring. No pleural effusion. No pneumothorax. HEART AND MEDIASTINUM: Aortic calcification. No acute abnormality of the cardiac silhouette. BONES AND SOFT TISSUES: No acute osseous abnormality. IMPRESSION:  1. No acute cardiopulmonary findings. Electronically signed by: Norman Gatlin MD 08/18/2024 10:56 PM EST RP Workstation: [id number]     Subjective: No acute issues or events overnight denies nausea vomiting diarrhea constipation headache fevers chills or chest pain   Discharge Exam: Vitals:   09/01/24 0700 09/01/24 1256  BP: (!) 126/48 (!) 128/50  Pulse:  70  Resp:  (!) 24  Temp: 98.6 F (37 C) 98.7 F (37.1 C)  SpO2:  94%   Vitals:   09/01/24 0415 09/01/24 0700 09/01/24 1042 09/01/24 1256  BP:  (!) 126/48  (!) 128/50  Pulse:    70  Resp:    (!) 24  Temp:  98.6 F (37 C)  98.7 F (37.1 C)  TempSrc:  Oral Oral Oral  SpO2:    94%  Weight: 69.2 kg     Height:        General: Pt is alert, awake, not in acute distress Cardiovascular: RRR, S1/S2 +, no rubs, no gallops -temporary dialysis catheter noted RIJ Respiratory: CTA bilaterally, no wheezing, no rhonchi Abdominal: Soft, NT, ND, bowel sounds + Extremities: no edema, no cyanosis    The results of significant diagnostics from this hospitalization  (including imaging, microbiology, ancillary and laboratory) are listed below for reference.     Microbiology: Recent Results (from the past 240 hours)  Surgical PCR screen     Status: None   Collection Time: 08/29/24  5:32 AM   Specimen: Nasal Mucosa; Nasal Swab  Result Value Ref Range Status   MRSA, PCR NEGATIVE NEGATIVE Final   Staphylococcus aureus NEGATIVE NEGATIVE Final    Comment: (NOTE) The Xpert SA Assay (FDA approved for NASAL specimens in patients 39 years of age and older), is one component of a comprehensive surveillance program. It is not intended to diagnose infection nor to guide or monitor treatment. Performed at [facility], [address] [place], [place] [address]      Labs: BNP (last 3 results) No results for input(s): BNP in the last 8760 hours. Basic Metabolic Panel: Recent Labs  Lab 08/27/24 1748 08/28/24 0241 08/28/24 0831 08/29/24 0252 08/30/24 0223 08/31/24 0637  NA 139 139  --  140 137 140  K 5.4* 6.1* 4.6 4.6 3.7 3.5  CL 113* 114*  --  109 103 106  CO2 18* 19*  --  21* 24 27  GLUCOSE 95 90  --  89 101* 96  BUN 75* 76*  --  70* 44* 46*  CREATININE 6.52* 6.74*  --  6.03* 4.56* 4.99*  CALCIUM  7.9* 7.8*  --  8.1* 7.7* 7.8*  PHOS  --   --   --   --  4.7*  --    Liver Function Tests: Recent Labs  Lab 08/30/24 0223  ALBUMIN  2.8*   No results for input(s): LIPASE, AMYLASE in the last 168 hours. No results for input(s): AMMONIA in the last 168 hours. CBC: Recent Labs  Lab 08/27/24 0204 08/27/24 0355 08/27/24 1748 08/28/24 0241 08/29/24 0252  WBC 5.3 4.7 5.5 5.9 6.0  HGB 6.9* 5.9* 9.9* 9.8* 10.5*  HCT 23.5* 20.1* 32.6* 31.7* 33.3*  MCV 90.7 91.8 88.8 89.3 88.1  PLT 184 165 160 166 166   Cardiac Enzymes: No results for input(s): CKTOTAL, CKMB, CKMBINDEX, TROPONINI in the last 168 hours. BNP: Invalid input(s): POCBNP CBG: Recent Labs  Lab 08/31/24 1427 08/31/24 1556 08/31/24 2042 09/01/24 0530  09/01/24 1136  GLUCAP 94 103* 203* 90 103*   D-Dimer  No results for input(s): DDIMER in the last 72 hours. Hgb A1c No results for input(s): HGBA1C in the last 72 hours. Lipid Profile No results for input(s): CHOL, HDL, LDLCALC, TRIG, CHOLHDL, LDLDIRECT in the last 72 hours. Thyroid  function studies No results for input(s): TSH, T4TOTAL, T3FREE, THYROIDAB in the last 72 hours.  Invalid input(s): FREET3 Anemia work up No results for input(s): VITAMINB12, FOLATE, FERRITIN, TIBC, IRON, RETICCTPCT in the last 72 hours. Urinalysis    Component Value Date/Time   COLORURINE COLORLESS (A) 08/27/2024 0349   APPEARANCEUR CLEAR 08/27/2024 0349   LABSPEC 1.008 08/27/2024 0349   PHURINE 5.0 08/27/2024 0349   GLUCOSEU 50 (A) 08/27/2024 0349   HGBUR SMALL (A) 08/27/2024 0349   BILIRUBINUR NEGATIVE 08/27/2024 0349   KETONESUR NEGATIVE 08/27/2024 0349   PROTEINUR 100 (A) 08/27/2024 0349   NITRITE NEGATIVE 08/27/2024 0349   LEUKOCYTESUR NEGATIVE 08/27/2024 0349   Sepsis Labs Recent Labs  Lab 08/27/24 0355 08/27/24 1748 08/28/24 0241 08/29/24 0252  WBC 4.7 5.5 5.9 6.0   Microbiology Recent Results (from the past 240 hours)  Surgical PCR screen     Status: None   Collection Time: 08/29/24  5:32 AM   Specimen: Nasal Mucosa; Nasal Swab  Result Value Ref Range Status   MRSA, PCR NEGATIVE NEGATIVE Final   Staphylococcus aureus NEGATIVE NEGATIVE Final    Comment: (NOTE) The Xpert SA Assay (FDA approved for NASAL specimens in patients 43 years of age and older), is one component of a comprehensive surveillance program. It is not intended to diagnose infection nor to guide or monitor treatment. Performed at [facility], [address], [place], [place] [address]      Time coordinating discharge: Over 30 minutes  SIGNED:   Elsie JAYSON Montclair, DO [facility] 09/01/2024, 5:06 PM Pager   If 7PM-7AM, please contact  night-coverage [email or web address]     [1]  Allergies Allergen Reactions   Other Other (See Comments)    PATIENT IS EXTREMELY SENSITIVE TO ALL PAIN MEDICATIONS PER FAMILY MEMBERS WHO MANAGE MEDS AT HOME.    Oxycodone Other (See Comments)    Drowsiness, shaking    Pregabalin Anxiety and Other (See Comments)    Altered mental status tremors

## 2024-09-01 NOTE — Progress Notes (Signed)
 [facility] NEPHROLOGY PROGRESS NOTE  Assessment/ Plan: Pt is a 78 y.o. yo female with past medical history significant for HTN, HLD, type II DM, COPD, advanced CKD was presented with chest pain, hyperkalemia and worsening renal failure.  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   - [facility] 08/1124 by VVS- AVF when she has cardiac workup complete - HD #212/13/25 - CLIP complete - can d/c from renal perspective  # Hyperkalemia: Status post medical management now starting dialysis.  Discontinue Lokelma .  Follow lab.  # Hypertension/volume: UF with HD, currently on carvedilol .  # Anemia of CKD: Iron saturation 32% and hemoglobin improved to 10.5.  Monitor hemoglobin and assess for erythropoietin need.  # CKD-MBD: phos acceptable.  Start calcitriol   #CP/CAD: will need eventual LHC  Subjective: Had some oozing from [facility]- gauze placed, got DDAVP .  Looks resolved now.   Objective Vital signs in last 24 hours: Vitals:   09/01/24 0000 09/01/24 0413 09/01/24 0415 09/01/24 0700  BP: (!) 120/45 (!) 153/60  (!) 126/48  Pulse: 66 63    Resp: 20 17    Temp: 98.9 F (37.2 C) 98.8 F (37.1 C)  98.6 F (37 C)  TempSrc: Oral Oral  Oral  SpO2: 100% 99%    Weight:   69.2 kg   Height:       Weight change: 2.2 kg  Intake/Output Summary (Last 24 hours) at 09/01/2024 1023 Last data filed at 09/01/2024 9072 Gross per 24 hour  Intake 6 ml  Output 600 ml  Net -594 ml       Labs: RENAL PANEL Recent Labs  Lab 08/27/24 1748 08/28/24 0241 08/28/24 0831 08/29/24 0252 08/30/24 0223 08/31/24 0637  NA 139 139  --  140 137 140  K 5.4* 6.1* 4.6 4.6 3.7 3.5  CL 113* 114*  --  109 103 106  CO2 18* 19*  --  21* 24 27  GLUCOSE 95 90  --  89 101* 96  BUN 75* 76*  --  70* 44* 46*  CREATININE 6.52* 6.74*  --  6.03* 4.56* 4.99*  CALCIUM  7.9* 7.8*  --  8.1* 7.7* 7.8*  PHOS  --   --   --   --  4.7*  --    ALBUMIN   --   --   --   --  2.8*  --     Liver Function Tests: Recent Labs  Lab 08/30/24 0223  ALBUMIN  2.8*   No results for input(s): LIPASE, AMYLASE in the last 168 hours. No results for input(s): AMMONIA in the last 168 hours. CBC: Recent Labs    08/27/24 0204 08/27/24 0355 08/27/24 1748 08/28/24 0241 08/28/24 1122 08/29/24 0252  HGB 6.9* 5.9* 9.9* 9.8*  --  10.5*  MCV 90.7 91.8 88.8 89.3  --  88.1  VITAMINB12  --   --   --   --  363  --   FOLATE  --   --   --   --  10.8  --   FERRITIN  --   --   --   --  250  --   TIBC  --   --   --   --  225*  --   IRON  --   --   --   --  71  --   RETICCTPCT  --   --   --   --  1.4  --  Cardiac Enzymes: No results for input(s): CKTOTAL, CKMB, CKMBINDEX, TROPONINI in the last 168 hours. CBG: Recent Labs  Lab 08/31/24 0624 08/31/24 1427 08/31/24 1556 08/31/24 2042 09/01/24 0530  GLUCAP 93 94 103* 203* 90    Iron Studies:  No results for input(s): IRON, TIBC, TRANSFERRIN, FERRITIN in the last 72 hours.  Studies/Results: VAS US  UPPER EXT VEIN MAPPING (PRE-OP AVF) Result Date: 08/30/2024 UPPER EXTREMITY VEIN MAPPING Patient Name:  Sabrina Hanson  Date of Exam:   08/30/2024 Medical Rec #: [id number]      Accession #:    [id number] Date of Birth: 09/14/1946     Patient Gender: F Patient Age:   89 years Exam Location:  [facility] Procedure:      VAS US  UPPER EXT VEIN MAPPING (PRE-OP AVF) Referring Phys: [facility] --------------------------------------------------------------------------------  Indications: Pre-access. History: ESRD.  Comparison Study: Previous study on 5.24.2024. Performing Technologist: Edilia Elden Appl  Examination Guidelines: A complete evaluation includes B-mode imaging, spectral Doppler, color Doppler, and power Doppler as needed of all accessible portions of each vessel. Bilateral testing is considered an integral part of a complete examination. Limited examinations for  reoccurring indications may be performed as noted. +-----------------+-------------+----------+--------------+ Right Cephalic   Diameter (cm)Depth (cm)   Findings    +-----------------+-------------+----------+--------------+ Shoulder             0.29        0.81                  +-----------------+-------------+----------+--------------+ Prox upper arm       0.25        0.39                  +-----------------+-------------+----------+--------------+ Mid upper arm        0.27        0.20                  +-----------------+-------------+----------+--------------+ Dist upper arm       0.29        0.28                  +-----------------+-------------+----------+--------------+ Antecubital fossa    0.48        0.18      Thrombus    +-----------------+-------------+----------+--------------+ Prox forearm         0.31        0.32                  +-----------------+-------------+----------+--------------+ Mid forearm          0.23        0.18     branching    +-----------------+-------------+----------+--------------+ Dist forearm         0.18        0.17     branching    +-----------------+-------------+----------+--------------+ Wrist                                   not visualized +-----------------+-------------+----------+--------------+ +-----------------+-------------+----------+--------------+ Right Basilic    Diameter (cm)Depth (cm)   Findings    +-----------------+-------------+----------+--------------+ Prox upper arm       0.40                              +-----------------+-------------+----------+--------------+ Mid upper arm        0.31                              +-----------------+-------------+----------+--------------+  Dist upper arm       0.37                              +-----------------+-------------+----------+--------------+ Antecubital fossa    0.32                               +-----------------+-------------+----------+--------------+ Prox forearm         0.26                              +-----------------+-------------+----------+--------------+ Mid forearm          0.15                              +-----------------+-------------+----------+--------------+ Distal forearm       0.16                              +-----------------+-------------+----------+--------------+ Elbow                0.23                 branching    +-----------------+-------------+----------+--------------+ Wrist                                   not visualized +-----------------+-------------+----------+--------------+ Chronic appearing thrombus noted in the right cephalic at the antecubital level. +-----------------+-------------+----------+--------------+ Left Cephalic    Diameter (cm)Depth (cm)   Findings    +-----------------+-------------+----------+--------------+ Shoulder             0.32        0.71                  +-----------------+-------------+----------+--------------+ Prox upper arm       0.22        0.53                  +-----------------+-------------+----------+--------------+ Mid upper arm        0.28        0.32                  +-----------------+-------------+----------+--------------+ Dist upper arm       0.29        0.26                  +-----------------+-------------+----------+--------------+ Antecubital fossa    0.37        0.19     branching    +-----------------+-------------+----------+--------------+ Prox forearm         0.28        0.32     branching    +-----------------+-------------+----------+--------------+ Mid forearm                             not visualized +-----------------+-------------+----------+--------------+ Dist forearm         0.20        0.14                  +-----------------+-------------+----------+--------------+ Wrist                0.17        0.14                   +-----------------+-------------+----------+--------------+ +-----------------+-------------+----------+---------+  Left Basilic     Diameter (cm)Depth (cm)Findings  +-----------------+-------------+----------+---------+ Mid upper arm        0.33                         +-----------------+-------------+----------+---------+ Dist upper arm       0.33                         +-----------------+-------------+----------+---------+ Antecubital fossa    0.20               branching +-----------------+-------------+----------+---------+ Prox forearm         0.16                         +-----------------+-------------+----------+---------+ Mid forearm          0.17                         +-----------------+-------------+----------+---------+ Distal forearm       0.12                         +-----------------+-------------+----------+---------+ Elbow                0.29                         +-----------------+-------------+----------+---------+ Wrist                0.15                         +-----------------+-------------+----------+---------+ *See table(s) above for measurements and observations.  Diagnosing physician: Gaile New MD Electronically signed by Gaile New MD on 08/30/2024 at 9:01:08 PM.    Final     Medications: Infusions:   Scheduled Medications:  sodium chloride    Intravenous Once   aspirin  EC  81 mg Oral Daily   atorvastatin   80 mg Oral Daily   budesonide -glycopyrrolate -formoterol   2 puff Inhalation BID   calcitRIOL   0.25 mcg Oral Q T,Th,Sa-HD   carvedilol   25 mg Oral BID WC   Chlorhexidine  Gluconate Cloth  6 each Topical Q0600   [START ON 09/03/2024] clopidogrel   75 mg Oral Daily   heparin   5,000 Units Subcutaneous Q8H   insulin  aspart  0-5 Units Subcutaneous QHS   insulin  aspart  0-6 Units Subcutaneous TID WC   pantoprazole   40 mg Oral BID AC   sodium chloride  flush  3 mL Intravenous Q12H    have reviewed scheduled and prn  medications.  Physical Exam: General:NAD, comfortable Heart:RRR, s1s2 nl Lungs:clear b/l, no crackle Abdomen:soft, Non-tender, non-distended Extremities:No edema Dialysis Access: Right IJ TDC in place  Colum Colt 09/01/2024,10:23 AM  LOS: 5 days

## 2024-09-02 NOTE — Discharge Planning (Signed)
 [facility]  Initial Hemodialysis Orders  Dialysis center: [place]  Patient's name: Sabrina Hanson DOB: March 02, 1946 AKI or ESRD: ESRD  Past Medical History: HTN, HLD, type II DM, COPD, advanced CKD   Discharge diagnosis: CKD 5 now ESRD - HD started 12/11 2. Hyperkalemia - Resolved with HD 3. Acute on chronic anemia - s/p 2 units PRBCs 4. Chest pain - Echo 12/10 -EF 60 to 65% normal LV function with grade 1 diastolic dysfunction; followed by Cardiology  Allergies: Allergies[1]  Date of First Dialysis: 08/29/24 Cause of renal disease: Hypertensive nephrosclerosis   Dialysis Prescription: Dialysis Frequency: TTS Tx duration: 3.5hrs then 4hrs  BFR: 350 then 400  DFR: 500 then 600 EDW: 71.5kg  Dialyzer: 180NRe UF profile/Sodium modeling?: None Dialysis Bath: 2 K/2.5 Ca  Dialysis access: *[facility] for now (placed in IR 12/11). Per VVS note from 12/12, will defer perm access placement until she has had her coronary revascularization completed.  In Center Medications: Heparin  Dose: *hold off on heparin  bolus for now. Noted patient was having mild issue with bleeding from [facility] site. Notify renal team for any issues with clotting. Can resume Heparin  if no issues with bleeding for at least a week! VDRA: Calcitriol  0.25mcg q HD Venofer: Per protocol Mircera: Per protocol  Discharge labs: Hgb: 10.5  K+: 3.5  Ca: 7.8  Phos: 4.7  Alb: 2.8 *S/p 2 units PRBCs on 12/9    Please draw routine labs. Additional labs needed: Obtain labs per facility protocol  Sabrina Piety, NP      [1]  Allergies Allergen Reactions   Other Other (See Comments)    PATIENT IS EXTREMELY SENSITIVE TO ALL PAIN MEDICATIONS PER FAMILY MEMBERS WHO MANAGE MEDS AT HOME.    Oxycodone Other (See Comments)    Drowsiness, shaking    Pregabalin Anxiety and Other (See Comments)    Altered mental status tremors

## 2024-09-02 NOTE — Progress Notes (Signed)
 Late Note Entry- Sep 02, 2024  Pt was d/c yesterday. Contacted [facility] this morning to advise clinic of pt's d/c date and that pt should start tomorrow as planned. Contacted renal NP regarding clinic's need for orders (in case not sent over the weekend).   Randine Mungo Dialysis Navigator [phone]

## 2024-10-12 ENCOUNTER — Other Ambulatory Visit (HOSPITAL_COMMUNITY): Payer: Self-pay
